# Patient Record
Sex: Female | Born: 1950
Health system: Southern US, Community
[De-identification: ages and names within clinical notes are randomized; demographics above are authoritative.]

## PROBLEM LIST (undated history)

## (undated) DIAGNOSIS — M51369 Other intervertebral disc degeneration, lumbar region without mention of lumbar back pain or lower extremity pain: Secondary | ICD-10-CM

## (undated) DIAGNOSIS — IMO0002 Reserved for concepts with insufficient information to code with codable children: Secondary | ICD-10-CM

## (undated) DIAGNOSIS — M5126 Other intervertebral disc displacement, lumbar region: Secondary | ICD-10-CM

## (undated) DIAGNOSIS — M8580 Other specified disorders of bone density and structure, unspecified site: Secondary | ICD-10-CM

## (undated) DIAGNOSIS — M75100 Unspecified rotator cuff tear or rupture of unspecified shoulder, not specified as traumatic: Secondary | ICD-10-CM

## (undated) DIAGNOSIS — J302 Other seasonal allergic rhinitis: Secondary | ICD-10-CM

## (undated) DIAGNOSIS — M5136 Other intervertebral disc degeneration, lumbar region: Secondary | ICD-10-CM

## (undated) DIAGNOSIS — S83519A Sprain of anterior cruciate ligament of unspecified knee, initial encounter: Secondary | ICD-10-CM

## (undated) DIAGNOSIS — E7800 Pure hypercholesterolemia, unspecified: Secondary | ICD-10-CM

## (undated) HISTORY — PX: APPENDECTOMY: SHX54

## (undated) HISTORY — DX: Pure hypercholesterolemia, unspecified: E78.00

## (undated) HISTORY — DX: Other specified disorders of bone density and structure, unspecified site: M85.80

## (undated) HISTORY — DX: Sprain of anterior cruciate ligament of unspecified knee, initial encounter: S83.519A

## (undated) HISTORY — DX: Unspecified rotator cuff tear or rupture of unspecified shoulder, not specified as traumatic: M75.100

## (undated) HISTORY — DX: Reserved for concepts with insufficient information to code with codable children: IMO0002

## (undated) HISTORY — DX: Other intervertebral disc displacement, lumbar region: M51.26

## (undated) HISTORY — DX: Other intervertebral disc degeneration, lumbar region: M51.36

## (undated) HISTORY — DX: Other intervertebral disc degeneration, lumbar region without mention of lumbar back pain or lower extremity pain: M51.369

---

## 2006-05-13 ENCOUNTER — Ambulatory Visit: Payer: Self-pay | Admitting: Orthopedic Surgery

## 2007-01-06 ENCOUNTER — Other Ambulatory Visit: Admission: RE | Admit: 2007-01-06 | Discharge: 2007-01-06 | Payer: Self-pay | Admitting: Obstetrics and Gynecology

## 2007-03-14 ENCOUNTER — Ambulatory Visit: Payer: Self-pay | Admitting: Orthopedic Surgery

## 2007-03-24 ENCOUNTER — Ambulatory Visit: Payer: Self-pay | Admitting: Orthopedic Surgery

## 2007-06-02 ENCOUNTER — Encounter (INDEPENDENT_AMBULATORY_CARE_PROVIDER_SITE_OTHER): Payer: Self-pay | Admitting: Radiology

## 2007-06-02 ENCOUNTER — Ambulatory Visit: Payer: Self-pay | Admitting: Orthopedic Surgery

## 2007-06-02 DIAGNOSIS — S92919A Unspecified fracture of unspecified toe(s), initial encounter for closed fracture: Secondary | ICD-10-CM | POA: Insufficient documentation

## 2008-01-12 ENCOUNTER — Other Ambulatory Visit: Admission: RE | Admit: 2008-01-12 | Discharge: 2008-01-12 | Payer: Self-pay | Admitting: Obstetrics and Gynecology

## 2010-09-07 ENCOUNTER — Encounter: Payer: Self-pay | Admitting: Orthopedic Surgery

## 2013-04-27 ENCOUNTER — Ambulatory Visit: Payer: Self-pay | Admitting: Certified Nurse Midwife

## 2013-05-04 ENCOUNTER — Encounter: Payer: Self-pay | Admitting: Certified Nurse Midwife

## 2013-05-09 ENCOUNTER — Encounter: Payer: Self-pay | Admitting: Certified Nurse Midwife

## 2013-05-09 ENCOUNTER — Ambulatory Visit (INDEPENDENT_AMBULATORY_CARE_PROVIDER_SITE_OTHER): Payer: PRIVATE HEALTH INSURANCE | Admitting: Certified Nurse Midwife

## 2013-05-09 VITALS — BP 110/64 | HR 64 | Resp 16 | Ht 66.0 in | Wt 178.0 lb

## 2013-05-09 DIAGNOSIS — Z01419 Encounter for gynecological examination (general) (routine) without abnormal findings: Secondary | ICD-10-CM

## 2013-05-09 DIAGNOSIS — Z0000 Encounter for general adult medical examination without abnormal findings: Secondary | ICD-10-CM

## 2013-05-09 LAB — POCT URINALYSIS DIPSTICK
Bilirubin, UA: NEGATIVE
Blood, UA: NEGATIVE
Glucose, UA: NEGATIVE
Ketones, UA: NEGATIVE

## 2013-05-09 NOTE — Patient Instructions (Signed)

## 2013-05-09 NOTE — Progress Notes (Signed)
62 y.o. G12P3003 Married Caucasian Fe here for annual exam.  Menopausal no HRT. Denies vaginal bleeding or vaginal dryness. Sees PCP for aex and labs. Still having issues with rotator cuff of left shoulder. Now has back issues slip discs, under follow up. New grandchild this year!! Spouse may loose job as VP at hospital so has had stress related to hoses issues. "taking one day at a time" No other health issues today.  LMP 08/17/1998. postmenopausal          Sexually active: yes  The current method of family planning is vasectomy.    Exercising: yes  physical therapy,walking Smoker:  no  Health Maintenance: Pap:  04-21-12 neg HPV HR neg MMG:  9/13 Colonoscopy:  none BMD:   2012 TDaP:  2013 Labs: Poct urine-neg Self breast exam: done monthly     Past Medical History  Diagnosis Date  . Osteopenia   . Hypercholesteremia   . Ulcer     Past Surgical History  Procedure Laterality Date  . Appendectomy    . Knee arthroscopy with anterior cruciate ligament (acl) repair Left     secondary to fall  . Rotator cuff repair Left     secondary to fall    Current Outpatient Prescriptions  Medication Sig Dispense Refill  . CALCIUM PO Take by mouth daily.      . Multiple Vitamins-Minerals (MULTIVITAMIN PO) Take by mouth daily.      Marland Kitchen SIMVASTATIN PO Take by mouth daily.       No current facility-administered medications for this visit.    Family History  Problem Relation Age of Onset  . Multiple myeloma Mother   . Cancer Father     throat  . Cancer Daughter     ovarian    ROS:  Pertinent items are noted in HPI.  Otherwise, a comprehensive ROS was negative.  Exam:   There were no vitals taken for this visit.    Ht Readings from Last 3 Encounters:  No data found for Ht    General appearance: alert, cooperative and appears stated age Head: Normocephalic, without obvious abnormality, atraumatic Neck: no adenopathy, supple, symmetrical, trachea midline and thyroid normal to  inspection and palpation Lungs: clear to auscultation bilaterally Breasts: normal appearance, no masses or tenderness, No nipple retraction or dimpling, No nipple discharge or bleeding, No axillary or supraclavicular adenopathy Heart: regular rate and rhythm Abdomen: soft, non-tender; no masses,  no organomegaly Extremities: extremities normal, atraumatic, no cyanosis or edema Skin: Skin color, texture, turgor normal. No rashes or lesions Lymph nodes: Cervical, supraclavicular, and axillary nodes normal. No abnormal inguinal nodes palpated Neurologic: Grossly normal   Pelvic: External genitalia:  no lesions              Urethra:  normal appearing urethra with no masses, tenderness or lesions              Bartholin's and Skene's: normal                 Vagina: normal appearing vagina with normal color and discharge, no lesions              Cervix: normal non tender              Pap taken: no Bimanual Exam:  Uterus:  normal size, contour, position, consistency, mobility, non-tender and mid position              Adnexa: normal adnexa and no mass, fullness, tenderness  Rectovaginal: Confirms               Anus:  normal sphincter tone, no lesions  A:  Well Woman with normal exam  Menopausal no HRT  Rotator cuff injury Left/ and slip disc in back under follow up  Social stress  P:     Reviewed health and wellness pertinent to exam  Pap smear as per guidelines   Mammogram yearly pap smear not taken today  counseled on breast self exam, mammography screening, menopause, adequate intake of calcium and vitamin D, diet and exercise, Kegel's exercises  return annually or prn  An After Visit Summary was printed and given to the patient.   Note reviewed, agree with plan.  Douglass Rivers, MD

## 2013-05-11 ENCOUNTER — Ambulatory Visit: Payer: Self-pay | Admitting: Certified Nurse Midwife

## 2013-12-12 ENCOUNTER — Ambulatory Visit (INDEPENDENT_AMBULATORY_CARE_PROVIDER_SITE_OTHER): Payer: PRIVATE HEALTH INSURANCE | Admitting: Cardiology

## 2013-12-12 VITALS — BP 115/83 | HR 65 | Ht 62.0 in | Wt 164.0 lb

## 2013-12-12 DIAGNOSIS — R079 Chest pain, unspecified: Secondary | ICD-10-CM

## 2013-12-12 NOTE — Progress Notes (Signed)
Clinical Summary Terri Robertson is a 63 y.o.female seen today as a new patient, she is referred for chest pain.  1. Chest pain - started approx 1 month ago. In midchest feeling heaviness, 8/10. +diaphoresis. +palpitations. Has occurred 3 times, all occurred at rest. Lasted approx 30 minutes. Worst with deep breathing. No relation to eating.  - remains active, denies any DOE with activity.   CAD risk factors: hyperlipidemia, father MI heart troubles in 51s, brother CABG 50s.      Past Medical History  Diagnosis Date  . Osteopenia   . Hypercholesteremia   . Ulcer   . Torn rotator cuff   . Torn ACL     & meniscus     No Known Allergies   Current Outpatient Prescriptions  Medication Sig Dispense Refill  . CALCIUM PO Take by mouth daily.      . Multiple Vitamins-Minerals (MULTIVITAMIN PO) Take by mouth daily.      . NONFORMULARY OR COMPOUNDED ITEM 3 (three) times daily. For pain      . pravastatin (PRAVACHOL) 40 MG tablet Take 40 mg by mouth daily.      . TRAMADOL HCL PO Take by mouth as needed.       No current facility-administered medications for this visit.     Past Surgical History  Procedure Laterality Date  . Appendectomy       No Known Allergies    Family History  Problem Relation Age of Onset  . Multiple myeloma Mother   . Cancer Father     throat  . Heart attack Father   . Cancer Daughter     ovarian  . Heart disease Brother      Social History Terri Robertson reports that she has never smoked. She does not have any smokeless tobacco history on file. Terri Robertson reports that she does not drink alcohol.   Review of Systems CONSTITUTIONAL: No weight loss, fever, chills, weakness or fatigue.  HEENT: Eyes: No visual loss, blurred vision, double vision or yellow sclerae.No hearing loss, sneezing, congestion, runny nose or sore throat.  SKIN: No rash or itching.  CARDIOVASCULAR: per HPI RESPIRATORY: No shortness of breath, cough or sputum.    GASTROINTESTINAL: No anorexia, nausea, vomiting or diarrhea. No abdominal pain or blood.  GENITOURINARY: No burning on urination, no polyuria NEUROLOGICAL: No headache, dizziness, syncope, paralysis, ataxia, numbness or tingling in the extremities. No change in bowel or bladder control.  MUSCULOSKELETAL: No muscle, back pain, joint pain or stiffness.  LYMPHATICS: No enlarged nodes. No history of splenectomy.  PSYCHIATRIC: No history of depression or anxiety.  ENDOCRINOLOGIC: No reports of sweating, cold or heat intolerance. No polyuria or polydipsia.  Marland Kitchen   Physical Examination There were no vitals filed for this visit. There were no vitals filed for this visit.  Gen: resting comfortably, no acute distress HEENT: no scleral icterus, pupils equal round and reactive, no palptable cervical adenopathy,  CV Resp: Clear to auscultation bilaterally GI: abdomen is soft, non-tender, non-distended, normal bowel sounds, no hepatosplenomegaly MSK: extremities are warm, no edema.  Skin: warm, no rash Neuro:  no focal deficits Psych: appropriate affect   Diagnostic Studies EKG NSR, non-spec ST/T changes    Assessment and Plan  1. Chest pain - unclear etiology, she does have multiple CAD risk factors - will obtain stress echo to further evaluate - if negative, consider possible symptomatic arrhythmia as symptoms typically are associated with palpitations.    F/u pending results of  stress echo   Arnoldo Lenis, M.D., F.A.C.C.

## 2013-12-12 NOTE — Patient Instructions (Signed)
Your physician recommends that you schedule a follow-up appointment to be determined after stress test    Your physician recommends that you continue on your current medications as directed. Please refer to the Current Medication list given to you today.    Your physician has requested that you have a stress echocardiogram. For further information please visit [email or web address]. Please follow instruction sheet as given.     Thank you for choosing [facility] !

## 2013-12-19 ENCOUNTER — Encounter (HOSPITAL_COMMUNITY): Payer: Self-pay

## 2013-12-19 ENCOUNTER — Ambulatory Visit (HOSPITAL_COMMUNITY)
Admission: RE | Admit: 2013-12-19 | Discharge: 2013-12-19 | Disposition: A | Discharge status: Home / Self Care | Payer: PRIVATE HEALTH INSURANCE | Source: Ambulatory Visit | Attending: Cardiology | Admitting: Cardiology

## 2013-12-19 DIAGNOSIS — R079 Chest pain, unspecified: Secondary | ICD-10-CM

## 2013-12-19 DIAGNOSIS — Z6830 Body mass index (BMI) 30.0-30.9, adult: Secondary | ICD-10-CM | POA: Insufficient documentation

## 2013-12-19 DIAGNOSIS — R072 Precordial pain: Secondary | ICD-10-CM

## 2013-12-19 DIAGNOSIS — E785 Hyperlipidemia, unspecified: Secondary | ICD-10-CM | POA: Insufficient documentation

## 2013-12-19 NOTE — Progress Notes (Signed)
Stress Lab Nurses Notes - [name]  Terri Robertson 12/19/2013 Reason for doing test: Chest Pain Type of test: Stress Echo Nurse performing test: Gerrit Halls, RN Nuclear Medicine Tech: Not Applicable Echo Tech: Jamison Neighbor MD performing test: S. McDowell/K.Purcell Nails NP Family MD: [place] Crane Test explained and consent signed: yes IV started: No IV started Symptoms: fatigue Treatment/Intervention: None Reason test stopped: fatigue and reached target HR After recovery IV was: NA Patient to return to [facility]. Med at : NA Patient discharged: Home Patient's Condition upon discharge was: stable Comments: During test peak BP 170/84 & HR179.  Recovery BP 136/83 & HR 101. Symptoms resolved in recovery. [name]

## 2013-12-19 NOTE — Progress Notes (Signed)
  Echocardiogram Echocardiogram Stress Test has been performed.  Kyrus Hyde L Anecia Nusbaum 12/19/2013, 9:08 AM

## 2014-01-30 ENCOUNTER — Ambulatory Visit: Payer: PRIVATE HEALTH INSURANCE | Admitting: Cardiology

## 2014-02-06 ENCOUNTER — Encounter: Payer: Self-pay | Admitting: Cardiology

## 2014-02-06 ENCOUNTER — Ambulatory Visit (INDEPENDENT_AMBULATORY_CARE_PROVIDER_SITE_OTHER): Payer: PRIVATE HEALTH INSURANCE | Admitting: Cardiology

## 2014-02-06 VITALS — BP 117/79 | HR 81 | Ht 62.0 in | Wt 162.0 lb

## 2014-02-06 DIAGNOSIS — R0789 Other chest pain: Secondary | ICD-10-CM

## 2014-02-06 NOTE — Patient Instructions (Signed)
Continue all medications.   Follow up as needed

## 2014-02-06 NOTE — Progress Notes (Signed)
Clinical Summary Ms. Telfair is a 63 y.o.female seen today for follow up of the following medical problems.    1. Chest pain  - no symptoms since last visit - remains active, denies any DOE with activity.  - bicycling trip went 17 miles without troubles.  - since last visit completed stress echo which ws negative.    Past Medical History  Diagnosis Date  . Osteopenia   . Hypercholesteremia   . Ulcer   . Torn rotator cuff   . Torn ACL     & meniscus     No Known Allergies   Current Outpatient Prescriptions  Medication Sig Dispense Refill  . diclofenac (FLECTOR) 1.3 % PTCH Place 1 patch onto the skin daily.      . Multiple Vitamins-Minerals (MULTIVITAMIN PO) Take by mouth daily.      . pantoprazole (PROTONIX) 40 MG tablet Take 40 mg by mouth daily.      . pravastatin (PRAVACHOL) 40 MG tablet Take 20 mg by mouth daily.       . TRAMADOL HCL PO Take 50 mg by mouth as needed.        No current facility-administered medications for this visit.     Past Surgical History  Procedure Laterality Date  . Appendectomy       No Known Allergies    Family History  Problem Relation Age of Onset  . Multiple myeloma Mother   . Cancer Father     throat  . Heart attack Father   . Cancer Daughter     ovarian  . Heart disease Brother      Social History Ms. Heisler reports that she has never smoked. She does not have any smokeless tobacco history on file. Ms. Wycoff reports that she does not drink alcohol.   Review of Systems CONSTITUTIONAL: No weight loss, fever, chills, weakness or fatigue.  HEENT: Eyes: No visual loss, blurred vision, double vision or yellow sclerae.No hearing loss, sneezing, congestion, runny nose or sore throat.  SKIN: No rash or itching.  CARDIOVASCULAR: per HPI RESPIRATORY: No shortness of breath, cough or sputum.  GASTROINTESTINAL: No anorexia, nausea, vomiting or diarrhea. No abdominal pain or blood.  GENITOURINARY: No burning on  urination, no polyuria NEUROLOGICAL: No headache, dizziness, syncope, paralysis, ataxia, numbness or tingling in the extremities. No change in bowel or bladder control.  MUSCULOSKELETAL: No muscle, back pain, joint pain or stiffness.  LYMPHATICS: No enlarged nodes. No history of splenectomy.  PSYCHIATRIC: No history of depression or anxiety.  ENDOCRINOLOGIC: No reports of sweating, cold or heat intolerance. No polyuria or polydipsia.  Marland Kitchen   Physical Examination p 81 bp 117/79 Wt 162 lbs BMI 30 Gen: resting comfortably, no acute distress HEENT: no scleral icterus, pupils equal round and reactive, no palptable cervical adenopathy,  CV: RRR, no m/r/g, no JVD, no carotid bruits Resp: Clear to auscultation bilaterally GI: abdomen is soft, non-tender, non-distended, normal bowel sounds, no hepatosplenomegaly MSK: extremities are warm, no edema.  Skin: warm, no rash Neuro:  no focal deficits Psych: appropriate affect   Diagnostic Studies 12/19/13 Stress echo Study Conclusions  - Stress ECG conclusions: The stress ECG was equivocal for ischemia. [facility] scoring: exercise time of 11mn; maximum ST deviation of 0.556m no angina; resulting score is 8. This score predicts a low risk of cardiac events. - Staged echo: There was no diagnostic evidence for stress-induced ischemia.     Assessment and Plan  1. Chest pain  - negative  stress echo for ischemia, no clear cardiac cause of her symptoms - no recurrent symptoms - continue to follow clinically, no further cardiac testing planned at this time.    F/u as needed  Arnoldo Lenis, M.D.

## 2014-05-09 ENCOUNTER — Encounter: Payer: Self-pay | Admitting: Certified Nurse Midwife

## 2014-05-10 ENCOUNTER — Ambulatory Visit: Payer: PRIVATE HEALTH INSURANCE | Admitting: Certified Nurse Midwife

## 2014-05-29 ENCOUNTER — Ambulatory Visit (INDEPENDENT_AMBULATORY_CARE_PROVIDER_SITE_OTHER): Payer: PRIVATE HEALTH INSURANCE | Admitting: Certified Nurse Midwife

## 2014-05-29 ENCOUNTER — Encounter: Payer: Self-pay | Admitting: Certified Nurse Midwife

## 2014-05-29 VITALS — BP 124/70 | HR 72 | Ht 62.0 in | Wt 156.0 lb

## 2014-05-29 DIAGNOSIS — Z0000 Encounter for general adult medical examination without abnormal findings: Secondary | ICD-10-CM

## 2014-05-29 DIAGNOSIS — Z124 Encounter for screening for malignant neoplasm of cervix: Secondary | ICD-10-CM

## 2014-05-29 DIAGNOSIS — Z01419 Encounter for gynecological examination (general) (routine) without abnormal findings: Secondary | ICD-10-CM

## 2014-05-29 LAB — POCT URINALYSIS DIPSTICK
BILIRUBIN UA: NEGATIVE
Blood, UA: NEGATIVE
GLUCOSE UA: NEGATIVE
KETONES UA: NEGATIVE
Leukocytes, UA: NEGATIVE
NITRITE UA: NEGATIVE
Protein, UA: NEGATIVE
Urobilinogen, UA: NEGATIVE
pH, UA: 8

## 2014-05-29 NOTE — Patient Instructions (Signed)

## 2014-05-29 NOTE — Progress Notes (Signed)
63 y.o. G22P3003 Married Caucasian Fe here for annual exam. Menopausal no HRT. Denies vaginal bleeding or vaginal dryness. Sold house due to management change with spouse at hospital in [place]. Has been in a town house, now has purchased a [employer] which is better, so less stress now. Stress level is better. Injured back during moving and now receiving epidural injections for treatment.  Patient's last menstrual period was 08/17/1998.          Sexually active: Yes.    The current method of family planning is post menopausal status.    Exercising: Yes.    Home exercise routine includes core muscle excercise. Smoker:  no  Health Maintenance: Pap:  04/21/12 neg HPV HR neg MMG:  9/13 Bi-Rads Neg Colonoscopy:  none BMD:   2012 TDaP:  2013 Labs: pcp,   UA: neg, ph: 8.0   reports that she has never smoked. She has never used smokeless tobacco. She reports that she does not drink alcohol or use illicit drugs.  Past Medical History  Diagnosis Date  . Osteopenia   . Hypercholesteremia   . Ulcer   . Torn rotator cuff   . Torn ACL     & meniscus    Past Surgical History  Procedure Laterality Date  . Appendectomy      Current Outpatient Prescriptions  Medication Sig Dispense Refill  . diclofenac (FLECTOR) 1.3 % PTCH Place 1 patch onto the skin daily.      . Multiple Vitamins-Minerals (MULTIVITAMIN PO) Take by mouth daily.      . pravastatin (PRAVACHOL) 40 MG tablet Take 20 mg by mouth daily.       . TRAMADOL HCL PO Take 50 mg by mouth as needed.        No current facility-administered medications for this visit.    Family History  Problem Relation Age of Onset  . Multiple myeloma Mother   . Cancer Father     throat  . Heart attack Father   . Cancer Daughter     ovarian  . Heart disease Brother     ROS:  Pertinent items are noted in HPI.  Otherwise, a comprehensive ROS was negative.  Exam:   BP 124/70  Pulse 72  Ht 5' 2"  (1.575 m)  Wt 156 lb (70.761 kg)  BMI 28.53 kg/m2  LMP  08/17/1998 Height: 5' 2"  (157.5 cm)  Ht Readings from Last 3 Encounters:  05/29/14 5' 2"  (1.575 m)  02/06/14 5' 2"  (1.575 m)  12/12/13 5' 2"  (1.575 m)    General appearance: alert, cooperative and appears stated age Head: Normocephalic, without obvious abnormality, atraumatic Neck: no adenopathy, supple, symmetrical, trachea midline and thyroid normal to inspection and palpation Lungs: clear to auscultation bilaterally Breasts: normal appearance, no masses or tenderness, No nipple retraction or dimpling, No nipple discharge or bleeding, No axillary or supraclavicular adenopathy Heart: regular rate and rhythm Abdomen: soft, non-tender; no masses,  no organomegaly Extremities: extremities normal, atraumatic, no cyanosis or edema Skin: Skin color, texture, turgor normal. No rashes or lesions Lymph nodes: Cervical, supraclavicular, and axillary nodes normal. No abnormal inguinal nodes palpated Neurologic: Grossly normal   Pelvic: External genitalia:  no lesions              Urethra:  normal appearing urethra with no masses, tenderness or lesions              Bartholin's and Skene's: normal  Vagina: normal appearing vagina with normal color and discharge, no lesions              Cervix: normal, non tender, no lesions              Pap taken: Yes.   Bimanual Exam:  Uterus:  normal size, contour, position, consistency, mobility, non-tender and anteverted              Adnexa: normal adnexa and no mass, fullness, tenderness               Rectovaginal: Confirms               Anus:  normal sphincter tone, no lesions  A:  Well Woman with normal exam  Menopausal  No HRT  Recent back injury with moving under MD management  Social stress with move and family  Colonoscopy due  Mammogram over due  P:   Reviewed health and wellness pertinent to exam  Aware of need to evaluate if vaginal bleeding  Continue follow up as indicated  Encouraged to seek friend and church support and  time for self  Stressed importance of colonoscopy, will schedule, declines IFOB  Given information to schedule mammogram and BMD today! Patient will schedule.  Pap smear taken today with HPV reflex   counseled on breast self exam, mammography screening, menopause, osteoporosis, adequate intake of calcium and vitamin D, diet and exercise  return annually or prn  An After Visit Summary was printed and given to the patient.

## 2014-05-30 DIAGNOSIS — Z124 Encounter for screening for malignant neoplasm of cervix: Secondary | ICD-10-CM

## 2014-05-30 LAB — IPS PAP TEST WITH REFLEX TO HPV

## 2014-06-08 NOTE — Progress Notes (Signed)
Reviewed personally.  M. Suzanne Ellena Kamen, MD.  

## 2014-06-18 ENCOUNTER — Encounter: Payer: Self-pay | Admitting: Certified Nurse Midwife

## 2014-07-04 ENCOUNTER — Telehealth: Payer: Self-pay

## 2014-07-04 NOTE — Telephone Encounter (Signed)
Patient aware of need to come in and discuss BMD results due to being a fracture risk.  Scheduled patient for 09/20/14 at 8:00 with Dr. Quincy Simmonds  (Chart In Your Door Dr. Quincy Simmonds- CC: Ms. Jackelyn Poling)  Routed to provider for review, encounter closed.

## 2014-07-04 NOTE — Telephone Encounter (Signed)
lmtcb regarding bmd

## 2014-07-17 ENCOUNTER — Telehealth: Payer: Self-pay

## 2014-07-17 NOTE — Telephone Encounter (Signed)
Pt states she would like a call back to answer a question from her EOB. She states there a charge for 16.00 on a day that she wasn't in the office

## 2014-07-17 NOTE — Telephone Encounter (Signed)
Terri Robertson, Can you take a look at this one and review?

## 2014-09-20 ENCOUNTER — Ambulatory Visit (INDEPENDENT_AMBULATORY_CARE_PROVIDER_SITE_OTHER): Payer: PRIVATE HEALTH INSURANCE | Admitting: Obstetrics and Gynecology

## 2014-09-20 ENCOUNTER — Encounter: Payer: Self-pay | Admitting: Certified Nurse Midwife

## 2014-09-20 ENCOUNTER — Encounter: Payer: Self-pay | Admitting: Obstetrics and Gynecology

## 2014-09-20 VITALS — BP 130/80 | HR 66 | Ht 66.0 in | Wt 162.2 lb

## 2014-09-20 DIAGNOSIS — M8580 Other specified disorders of bone density and structure, unspecified site: Secondary | ICD-10-CM

## 2014-09-20 LAB — COMPREHENSIVE METABOLIC PANEL
ALBUMIN: 4 g/dL (ref 3.5–5.2)
ALK PHOS: 65 U/L (ref 39–117)
ALT: 18 U/L (ref 0–35)
AST: 15 U/L (ref 0–37)
BUN: 17 mg/dL (ref 6–23)
CO2: 31 meq/L (ref 19–32)
CREATININE: 0.68 mg/dL (ref 0.50–1.10)
Calcium: 9.6 mg/dL (ref 8.4–10.5)
Chloride: 102 mEq/L (ref 96–112)
Glucose, Bld: 98 mg/dL (ref 70–99)
Potassium: 4.8 mEq/L (ref 3.5–5.3)
Sodium: 139 mEq/L (ref 135–145)
TOTAL PROTEIN: 6.3 g/dL (ref 6.0–8.3)
Total Bilirubin: 0.4 mg/dL (ref 0.2–1.2)

## 2014-09-20 LAB — CBC
HEMATOCRIT: 45.6 % (ref 36.0–46.0)
Hemoglobin: 14.7 g/dL (ref 12.0–15.0)
MCH: 28.4 pg (ref 26.0–34.0)
MCHC: 32.2 g/dL (ref 30.0–36.0)
MCV: 88.2 fL (ref 78.0–100.0)
MPV: 8.5 fL — ABNORMAL LOW (ref 8.6–12.4)
Platelets: 337 10*3/uL (ref 150–400)
RBC: 5.17 MIL/uL — AB (ref 3.87–5.11)
RDW: 14.3 % (ref 11.5–15.5)
WBC: 6.3 10*3/uL (ref 4.0–10.5)

## 2014-09-20 NOTE — Telephone Encounter (Signed)
I have contacted this patient regarding mychart message sent today 09/20/2014. Please see mychart message I have sent to patient below.  Mrs. Gerilyn Nestle,  My name is Verline Lema. I am one of the triage nurses here in the office with Dr.Silva.I have changed your height in your record to 5'6". Please let us know if there is anything further we may help you with. I do apologize for the need of the correction.  Have a great day!  Leandra Kern, RN  Routing to provider for final review. Patient agreeable to disposition. Will close encounter

## 2014-09-20 NOTE — Patient Instructions (Addendum)
Osteoporosis Throughout your life, your body breaks down old bone and replaces it with new bone. As you get older, your body does not replace bone as quickly as it breaks it down. By the age of 30 years, most people begin to gradually lose bone because of the imbalance between bone loss and replacement. Some people lose more bone than others. Bone loss beyond a specified normal degree is considered osteoporosis.  Osteoporosis affects the strength and durability of your bones. The inside of the ends of your bones and your flat bones, like the bones of your pelvis, look like honeycomb, filled with tiny open spaces. As bone loss occurs, your bones become less dense. This means that the open spaces inside your bones become bigger and the walls between these spaces become thinner. This makes your bones weaker. Bones of a person with osteoporosis can become so weak that they can break (fracture) during minor accidents, such as a simple fall. CAUSES  The following factors have been associated with the development of osteoporosis:  Smoking.  Drinking more than 2 alcoholic drinks several days per week.  Long-term use of certain medicines:  Corticosteroids.  Chemotherapy medicines.  Thyroid medicines.  Antiepileptic medicines.  Gonadal hormone suppression medicine.  Immunosuppression medicine.  Being underweight.  Lack of physical activity.  Lack of exposure to the sun. This can lead to vitamin D deficiency.  Certain medical conditions:  Certain inflammatory bowel diseases, such as Crohn disease and ulcerative colitis.  Diabetes.  Hyperthyroidism.  Hyperparathyroidism. RISK FACTORS Anyone can develop osteoporosis. However, the following factors can increase your risk of developing osteoporosis:  Gender--Women are at higher risk than men.  Age--Being older than 50 years increases your risk.  Ethnicity--White and Asian people have an increased risk.  Weight --Being extremely  underweight can increase your risk of osteoporosis.  Family history of osteoporosis--Having a family member who has developed osteoporosis can increase your risk. SYMPTOMS  Usually, people with osteoporosis have no symptoms.  DIAGNOSIS  Signs during a physical exam that may prompt your caregiver to suspect osteoporosis include:  Decreased height. This is usually caused by the compression of the bones that form your spine (vertebrae) because they have weakened and become fractured.  A curving or rounding of the upper back (kyphosis). To confirm signs of osteoporosis, your caregiver may request a procedure that uses 2 low-dose X-ray beams with different levels of energy to measure your bone mineral density (dual-energy X-ray absorptiometry [DXA]). Also, your caregiver may check your level of vitamin D. TREATMENT  The goal of osteoporosis treatment is to strengthen bones in order to decrease the risk of bone fractures. There are different types of medicines available to help achieve this goal. Some of these medicines work by slowing the processes of bone loss. Some medicines work by increasing bone density. Treatment also involves making sure that your levels of calcium and vitamin D are adequate. PREVENTION  There are things you can do to help prevent osteoporosis. Adequate intake of calcium and vitamin D can help you achieve optimal bone mineral density. Regular exercise can also help, especially resistance and weight-bearing activities. If you smoke, quitting smoking is an important part of osteoporosis prevention. MAKE SURE YOU:  Understand these instructions.  Will watch your condition.  Will get help right away if you are not doing well or get worse. FOR MORE INFORMATION www.osteo.org and www.nof.org Document Released: 05/13/2005 Document Revised: 11/28/2012 Document Reviewed: 07/18/2011 Exit[employer] Patient Information 2015 [employer]. This information is not   intended to replace advice  given to you by your health care provider. Make sure you discuss any questions you have with your health care provider.  Raloxifene tablets What is this medicine? RALOXIFENE (ral OX i feen) reduces the amount of calcium lost from bones. It is used to treat and prevent osteoporosis in women who have experienced menopause. This medicine may be used for other purposes; ask your health care provider or pharmacist if you have questions. COMMON BRAND NAME(S): Evista What should I tell my health care provider before I take this medicine? They need to know if you have any of these conditions: -a history of blood clots -cancer -heart failure -liver disease -premenopausal -an unusual or allergic reaction to raloxifene, other medicines, foods, dyes, or preservatives -pregnant or trying to get pregnant -breast-feeding How should I use this medicine? Take this medicine by mouth with a glass of water. Follow the directions on the prescription label. The tablets can be taken with or without food. Take your doses at regular intervals. Do not take your medicine more often than directed. Talk to your pediatrician regarding the use of this medicine in children. Special care may be needed. Overdosage: If you think you have taken too much of this medicine contact a poison control center or emergency room at once. NOTE: This medicine is only for you. Do not share this medicine with others. What if I miss a dose? If you miss a dose, take it as soon as you can. If it is almost time for your next dose, take only that dose. Do not take double or extra doses. What may interact with this medicine? -ampicillin -cholestyramine -colestipol -diazepam -diazoxide -female hormones like hormone replacement therapy -lidocaine -warfarin This list may not describe all possible interactions. Give your health care provider a list of all the medicines, herbs, non-prescription drugs, or dietary supplements you use. Also tell  them if you smoke, drink alcohol, or use illegal drugs. Some items may interact with your medicine. What should I watch for while using this medicine? Visit your doctor or health care professional for regular checks on your progress. Do not stop taking this medicine except on the advice of your doctor or health care professional. You should make sure you get enough calcium and vitamin D in your diet while you are taking this medicine. Discuss your dietary needs with your health care professional or nutritionist. Exercise may help to prevent bone loss. Discuss your exercise needs with your doctor or health care professional. This medicine can rarely cause blood clots. You should avoid long periods of bed rest while taking this medicine. If you are going to have surgery, tell your doctor or health care professional that you are taking this medicine. This medicine should be stopped at least 3 days before surgery. After surgery, it should be restarted only after you are walking again. It should not be restarted while you still need long periods of bed rest. You should not smoke while taking this medicine. Smoking may also increase your risk of blood clots. Smoking can also decrease the effects of this medicine. This medicine does not prevent hot flashes. It may cause hot flashes in some patients at the start of therapy. What side effects may I notice from receiving this medicine? Side effects that you should report to your doctor or health care professional as soon as possible: -change in vision -chest pain -difficulty breathing -leg pain or swelling -skin rash, itching Side effects that usually do not require  medical attention (report to your doctor or health care professional if they continue or are bothersome): -fluid build-up -leg cramps -stomach pain -sweating This list may not describe all possible side effects. Call your doctor for medical advice about side effects. You may report side effects  to FDA at 1-800-FDA-1088. Where should I keep my medicine? Keep out of the reach of children. Store at room temperature between 15 and 30 degrees C (59 and 86 degrees F). Throw away any unused medicine after the expiration date. NOTE: This sheet is a summary. It may not cover all possible information. If you have questions about this medicine, talk to your doctor, pharmacist, or health care provider.  2015, Elsevier/Gold Standard. (2008-07-19 15:15:14)  Denosumab injection What is this medicine? DENOSUMAB (den oh sue mab) slows bone breakdown. Prolia is used to treat osteoporosis in women after menopause and in men. Delton See is used to prevent bone fractures and other bone problems caused by cancer bone metastases. Delton See is also used to treat giant cell tumor of the bone. This medicine may be used for other purposes; ask your health care provider or pharmacist if you have questions. COMMON BRAND NAME(S): Prolia, XGEVA What should I tell my health care provider before I take this medicine? They need to know if you have any of these conditions: -dental disease -eczema -infection or history of infections -kidney disease or on dialysis -low blood calcium or vitamin D -malabsorption syndrome -scheduled to have surgery or tooth extraction -taking medicine that contains denosumab -thyroid or parathyroid disease -an unusual reaction to denosumab, other medicines, foods, dyes, or preservatives -pregnant or trying to get pregnant -breast-feeding How should I use this medicine? This medicine is for injection under the skin. It is given by a health care professional in a hospital or clinic setting. If you are getting Prolia, a special MedGuide will be given to you by the pharmacist with each prescription and refill. Be sure to read this information carefully each time. For Prolia, talk to your pediatrician regarding the use of this medicine in children. Special care may be needed. For Delton See, talk to  your pediatrician regarding the use of this medicine in children. While this drug may be prescribed for children as young as 13 years for selected conditions, precautions do apply. Overdosage: If you think you've taken too much of this medicine contact a poison control center or emergency room at once. Overdosage: If you think you have taken too much of this medicine contact a poison control center or emergency room at once. NOTE: This medicine is only for you. Do not share this medicine with others. What if I miss a dose? It is important not to miss your dose. Call your doctor or health care professional if you are unable to keep an appointment. What may interact with this medicine? Do not take this medicine with any of the following medications: -other medicines containing denosumab This medicine may also interact with the following medications: -medicines that suppress the immune system -medicines that treat cancer -steroid medicines like prednisone or cortisone This list may not describe all possible interactions. Give your health care provider a list of all the medicines, herbs, non-prescription drugs, or dietary supplements you use. Also tell them if you smoke, drink alcohol, or use illegal drugs. Some items may interact with your medicine. What should I watch for while using this medicine? Visit your doctor or health care professional for regular checks on your progress. Your doctor or health care professional may  order blood tests and other tests to see how you are doing. Call your doctor or health care professional if you get a cold or other infection while receiving this medicine. Do not treat yourself. This medicine may decrease your body's ability to fight infection. You should make sure you get enough calcium and vitamin D while you are taking this medicine, unless your doctor tells you not to. Discuss the foods you eat and the vitamins you take with your health care professional. See  your dentist regularly. Brush and floss your teeth as directed. Before you have any dental work done, tell your dentist you are receiving this medicine. Do not become pregnant while taking this medicine or for 5 months after stopping it. Women should inform their doctor if they wish to become pregnant or think they might be pregnant. There is a potential for serious side effects to an unborn child. Talk to your health care professional or pharmacist for more information. What side effects may I notice from receiving this medicine? Side effects that you should report to your doctor or health care professional as soon as possible: -allergic reactions like skin rash, itching or hives, swelling of the face, lips, or tongue -breathing problems -chest pain -fast, irregular heartbeat -feeling faint or lightheaded, falls -fever, chills, or any other sign of infection -muscle spasms, tightening, or twitches -numbness or tingling -skin blisters or bumps, or is dry, peels, or red -slow healing or unexplained pain in the mouth or jaw -unusual bleeding or bruising Side effects that usually do not require medical attention (Report these to your doctor or health care professional if they continue or are bothersome.): -muscle pain -stomach upset, gas This list may not describe all possible side effects. Call your doctor for medical advice about side effects. You may report side effects to FDA at 1-800-FDA-1088. Where should I keep my medicine? This medicine is only given in a clinic, doctor's office, or other health care setting and will not be stored at home. NOTE: This sheet is a summary. It may not cover all possible information. If you have questions about this medicine, talk to your doctor, pharmacist, or health care provider.  2015, Elsevier/Gold Standard. (2012-02-01 12:37:47)

## 2014-09-20 NOTE — Progress Notes (Signed)
Patient ID: Terri Robertson, female   DOB: 10-02-1950, 64 y.o.   MRN: [id number] GYNECOLOGY VISIT  PCP: Delphina Cahill, MD Referring provider:   HPI: 64 y.o.   Married  Caucasian  female   [phone] with Patient's last menstrual period was 08/17/1998.   here for consultation regarding DEXA results.  New diagnosis of osteoporosis.  Has had osteopenia.   Taking Vit D 2400 IU daily and calcium 600 mg once daily.   Family history of heart disease. Had a stress test and patient had a normal study.   Not doing weight bearing exercise due to herniated discs. Has had multiple steroid injections for pain.  Had 3 injections last year.   Not a smoker.  No ETOH use.   Had a toe fracture after lost balance.   History of gastric ulcer.  Treated with antibiotics in past.  No current problems.  Takes NSAIDs without problems.   No history of reflux.   Status mother had a hip fracture and expired following this. She had bone cancer.   GYNECOLOGIC HISTORY: Patient's last menstrual period was 08/17/1998. Sexually active:  yes Partner preference: female Contraception: postmenopausal Menopausal hormone therapy: none DES exposure:  no  Blood transfusions:  no  Sexually transmitted diseases:   no GYN procedures and prior surgeries:  none Last mammogram: 06-22-14 LOV:FIEPP                Last pap and high risk HPV testing:  05-29-14 wnl:no HPV done  History of abnormal pap smear:  no  OB History    Gravida Para Term Preterm AB TAB SAB Ectopic Multiple Living   _0 LIFESTYLE: Exercise:   Home exercising includes core exercise            Tobacco:   No Alcohol:   No Drug use:  no  Patient Active Problem List   Diagnosis Date Noted  . FRACTURE, TOE 06/02/2007    Past Medical History  Diagnosis Date  . Osteopenia   . Hypercholesteremia   . Ulcer   . Torn rotator cuff   . Torn ACL     & meniscus  . Bulging lumbar disc     Past Surgical History  Procedure Laterality  Date  . Appendectomy      Current Outpatient Prescriptions  Medication Sig Dispense Refill  . calcium carbonate (OS-CAL) 600 MG TABS tablet Take 600 mg by mouth daily. Takes 3 (?) 648m of calcium daily**    . Multiple Vitamins-Minerals (MULTIVITAMIN PO) Take by mouth daily.    . pravastatin (PRAVACHOL) 40 MG tablet Take 20 mg by mouth daily.     .Marland KitchenVITAMIN D, ERGOCALCIFEROL, PO Take 1 tablet by mouth daily. Takes 3 tablets of 8019mdaily for a total of 240044m    . TRAMADOL HCL PO Take 50 mg by mouth as needed.      No current facility-administered medications for this visit.     ALLERGIES: Codeine  Family History  Problem Relation Age of Onset  . Multiple myeloma Mother   . Cancer Father     throat  . Heart attack Father   . Cancer Daughter     ovarian  . Heart disease Brother     History   Social History  . Marital Status: Married    Spouse Name: N/A    Number of Children: N/A  . Years of Education: N/A  Occupational History  . Not on file.   Social History Main Topics  . Smoking status: Never Smoker   . Smokeless tobacco: Never Used  . Alcohol Use: No  . Drug Use: No  . Sexual Activity:    Partners: Male     Comment: husband vasectomy   Other Topics Concern  . Not on file   Social History Narrative    ROS:  Pertinent items are noted in HPI.  PHYSICAL EXAMINATION:    BP 130/80 mmHg  Pulse 66  Ht 5' 2" (1.575 m)  Wt 162 lb 3.2 oz (73.573 kg)  BMI 29.66 kg/m2  LMP 08/17/1998   Wt Readings from Last 3 Encounters:  09/20/14 162 lb 3.2 oz (73.573 kg)  05/29/14 156 lb (70.761 kg)  02/06/14 162 lb (73.483 kg)     Ht Readings from Last 3 Encounters:  09/20/14 5' 2" (1.575 m)  05/29/14 5' 2" (1.575 m)  02/06/14 5' 2" (1.575 m)    General appearance: alert, cooperative and appears stated age   Bone density -  Spine T score: - 0.3 Left hip T score: - 2.0 Right hip T score: - 2.2  FRAX - 2% risk of hip fracture, 21.4% risk of major  osteoporotic fracture.  ASSESSMENT  Osteopenia.  Increased risk of fracture.  Family history of fracture in mother who had bone cancer. History of ulcerative disease.  PLAN  Reviewed bone density report.  Comprehensive discussion of osteopenia and osteoporosis:  Disease processes, risk factors, morbidity and mortality related to fractures, and treatment options.  Discussed options: bisphosphonates, Evista, Prolia, Forteo.  I favor Evista or Prolia.  Discussed fall reduction. Discussed effect of smoking and ETOH on bone health and fracture risk. Will check CBC, CMP, and vit D levels.  Discussed weight bearing exercise, calcium, and vitamin D.  Patient will consider options and do observational management for now.  Next bone density in 2 years.    25 minutes face to face time of which over 50% was spent in counseling.   An After Visit Summary was printed and given to the patient.        

## 2014-09-21 LAB — VITAMIN D 25 HYDROXY (VIT D DEFICIENCY, FRACTURES): Vit D, 25-Hydroxy: 34 ng/mL (ref 30–100)

## 2014-09-21 LAB — TSH: TSH: 2.028 u[IU]/mL (ref 0.350–4.500)

## 2014-09-25 ENCOUNTER — Telehealth: Payer: Self-pay

## 2014-09-25 NOTE — Telephone Encounter (Signed)
Spoke with patient in regards to height correction request. Patient is agreeable to come in for height check. Requesting appointment at the beginning of March due to travel and work.Appointment scheduled for 3/1 at 9:30am. Agreeable to date and time. No copay charge per Seth Bake.  Routing to provider for final review. Patient agreeable to disposition. Will close encounter

## 2014-09-25 NOTE — Telephone Encounter (Deleted)
-----   Message from [place], MD sent at 09/22/2014  8:34 PM EST ----- Regarding: We need to check the patient's height.  Marcene Laskowski,  I just reviewed the patient's height, and it states 5 feet 2 inches for the last several visits.   I think that it is best for the patient to come in to be measured.   We are following her for osteopenia, and it is important to be accurate.   I do not know if everyone is just carrying her measurement forward at her visits or if she has really lost some height.   Thanks,   [place]

## 2014-10-09 ENCOUNTER — Telehealth: Payer: Self-pay | Admitting: Obstetrics and Gynecology

## 2014-10-09 NOTE — Telephone Encounter (Signed)
Patient canceled her appointment 10/16/14. Patient did not wish to reschedule.Marland Kitchen

## 2014-10-10 NOTE — Telephone Encounter (Signed)
Patient does not need to reschedule her appointment.  She may see Terri Robertson for her annual exam in the fall and have her height checked then.  She had wanted a correction in her chart regarding her height measurement, and I wanted [place] to see if she is loosing height due to her bone thinning.  Encounter closed.   Terri Robertson

## 2014-10-10 NOTE — Telephone Encounter (Signed)
FYI--Dr. Quincy Simmonds, see note.  Patient cancelled appointment and did not r/s.  Does she need to reschedule?  Has AEX 06-04-15 with Debbie. Routed to Dr. Quincy Simmonds

## 2014-10-16 ENCOUNTER — Ambulatory Visit: Payer: PRIVATE HEALTH INSURANCE

## 2015-06-04 ENCOUNTER — Ambulatory Visit: Payer: PRIVATE HEALTH INSURANCE | Admitting: Certified Nurse Midwife

## 2015-06-13 ENCOUNTER — Encounter: Payer: Self-pay | Admitting: Certified Nurse Midwife

## 2015-06-13 ENCOUNTER — Ambulatory Visit (INDEPENDENT_AMBULATORY_CARE_PROVIDER_SITE_OTHER): Payer: PRIVATE HEALTH INSURANCE | Admitting: Certified Nurse Midwife

## 2015-06-13 VITALS — BP 140/92 | HR 70 | Resp 18 | Ht 66.0 in | Wt 178.0 lb

## 2015-06-13 DIAGNOSIS — Z01419 Encounter for gynecological examination (general) (routine) without abnormal findings: Secondary | ICD-10-CM | POA: Diagnosis not present

## 2015-06-13 NOTE — Progress Notes (Signed)
Patient ID: Terri Robertson, female   DOB: [birth year], 64 y.o.   MRN: [id number] 64 y.o. G39P3003 Married  Caucasian Fe here for annual exam.Menopausal no vaginal bleeding.or vaginal dryness. See PCP every 3 months. Still working on cholesterol with medication use control.  Son lost home in flood, so trying to help with all the devastation. Exercising has increased hoping will help with cholesterol. No health concerns today. Biked 14 miles in the [place]!! Recheck BP 110/62.  Patient's last menstrual period was 08/17/1998.          Sexually active: No.  The current method of family planning is vasectomy.    Exercising: Yes.    bikes 35mles 2x/week. Smoker:  no  Health Maintenance: Pap:  05-29-14 Neg:Neg HR HPV MMG:  06-22-14 Density Cat.B/Neg/BiRads1:[facility] Colonoscopy:  NEVER plans to have done BMD:   2015 Osteopenia--at higher risk factor for bone fracture due to spinal injections. TDaP:  Up to date with PCP Labs: PCP   reports that she has never smoked. She has never used smokeless tobacco. She reports that she does not drink alcohol or use illicit drugs.  Past Medical History  Diagnosis Date  . Osteopenia   . Hypercholesteremia   . Ulcer   . Torn rotator cuff   . Torn ACL     & meniscus  . Bulging lumbar disc     Past Surgical History  Procedure Laterality Date  . Appendectomy      Current Outpatient Prescriptions  Medication Sig Dispense Refill  . calcium carbonate (OS-CAL) 600 MG TABS tablet Take 600 mg by mouth daily. Takes 3 (?) 6087mof calcium daily**    . pravastatin (PRAVACHOL) 40 MG tablet Take 20 mg by mouth daily.     . Marland KitchenITAMIN D, ERGOCALCIFEROL, PO Take 1 tablet by mouth daily. Takes 3 tablets of 80012maily for a total of 2400m61m    No current facility-administered medications for this visit.    Family History  Problem Relation Age of Onset  . Multiple myeloma Mother   . Cancer Father     throat  . Heart attack Father   . Cancer Daughter     ovarian   . Heart disease Brother     ROS:  Pertinent items are noted in HPI.  Otherwise, a comprehensive ROS was negative.  Exam:   BP 140/92 mmHg  Pulse 70  Resp 18  Ht 5' 6"  (1.676 m)  Wt 178 lb (80.74 kg)  BMI 28.74 kg/m2  LMP 08/17/1998 Height: 5' 6"  (167.6 cm) Ht Readings from Last 3 Encounters:  06/13/15 5' 6"  (1.676 m)  09/20/14 5' 6"  (1.676 m)  05/29/14 5' 2"  (1.575 m)    General appearance: alert, cooperative and appears stated age Head: Normocephalic, without obvious abnormality, atraumatic Neck: no adenopathy, supple, symmetrical, trachea midline and thyroid normal to inspection and palpation Lungs: clear to auscultation bilaterally Breasts: normal appearance, no masses or tenderness, No nipple retraction or dimpling, No nipple discharge or bleeding, No axillary or supraclavicular adenopathy Heart: regular rate and rhythm Abdomen: soft, non-tender; no masses,  no organomegaly Extremities: extremities normal, atraumatic, no cyanosis or edema Skin: Skin color, texture, turgor normal. No rashes or lesions Lymph nodes: Cervical, supraclavicular, and axillary nodes normal. No abnormal inguinal nodes palpated Neurologic: Grossly normal   Pelvic: External genitalia:  no lesions              Urethra:  normal appearing urethra with no masses, tenderness or lesions  Bartholin's and Skene's: normal                 Vagina: normal appearing vagina with normal color and discharge, no lesions              Cervix: normal,non tender,no lesions              Pap taken: No. Bimanual Exam:  Uterus:  normal size, contour, position, consistency, mobility, non-tender              Adnexa: normal adnexa and no mass, fullness, tenderness               Rectovaginal: Confirms               Anus:  normal sphincter tone, no lesions    A:  Well Woman with normal exam  Menopausal no HRT  Hyperlipidemia with PCP management   P:   Reviewed health and wellness pertinent to exam  Aware  of need to evaluate if bleeding  Continue with MD as indicated  Pap smear as above not taken   counseled on breast self exam, mammography screening, adequate intake of calcium and vitamin D, diet and exercise, Kegel's exercises, Discussed risks and benefits of colonoscopy again and declines!! return annually or prn  An After Visit Summary was printed and given to the patient.

## 2015-06-13 NOTE — Progress Notes (Signed)
Reviewed personally.  M. Suzanne Chelbie Jarnagin, MD.  

## 2015-06-13 NOTE — Patient Instructions (Signed)

## 2015-07-08 ENCOUNTER — Encounter (INDEPENDENT_AMBULATORY_CARE_PROVIDER_SITE_OTHER): Payer: Self-pay | Admitting: *Deleted

## 2015-11-07 ENCOUNTER — Encounter (INDEPENDENT_AMBULATORY_CARE_PROVIDER_SITE_OTHER): Payer: Self-pay | Admitting: *Deleted

## 2015-11-08 ENCOUNTER — Other Ambulatory Visit (INDEPENDENT_AMBULATORY_CARE_PROVIDER_SITE_OTHER): Payer: Self-pay | Admitting: *Deleted

## 2015-11-08 DIAGNOSIS — Z1211 Encounter for screening for malignant neoplasm of colon: Secondary | ICD-10-CM

## 2015-12-18 ENCOUNTER — Other Ambulatory Visit (INDEPENDENT_AMBULATORY_CARE_PROVIDER_SITE_OTHER): Payer: Self-pay | Admitting: *Deleted

## 2015-12-18 ENCOUNTER — Encounter (INDEPENDENT_AMBULATORY_CARE_PROVIDER_SITE_OTHER): Payer: Self-pay | Admitting: *Deleted

## 2015-12-18 MED ORDER — PEG 3350-KCL-NA BICARB-NACL 420 G PO SOLR: 4000.0000 mL | Freq: Once | ORAL | Status: DC

## 2015-12-18 NOTE — Telephone Encounter (Signed)
Patient needs trilyte 

## 2015-12-24 ENCOUNTER — Telehealth (INDEPENDENT_AMBULATORY_CARE_PROVIDER_SITE_OTHER): Payer: Self-pay | Admitting: *Deleted

## 2015-12-24 NOTE — Telephone Encounter (Signed)
Referring MD/PCP: hall   Procedure: tcs  Reason/Indication:  screening  Has patient had this procedure before?  no  If so, when, by whom and where?    Is there a family history of colon cancer?  no  Who?  What age when diagnosed?    Is patient diabetic?   no      Does patient have prosthetic heart valve or mechanical valve?  no  Do you have a pacemaker?  no  Has patient ever had endocarditis? no  Has patient had joint replacement within last 12 months?  no  Does patient tend to be constipated or take laxatives? no  Does patient have a history of alcohol/drug use?  no  Is patient on Coumadin, Plavix and/or Aspirin? yes  Medications: goody powder daily, fibercor 105 mg daily, calcium, vit d3, zyrtec 10 mg daily  Allergies: codeine  Medication Adjustment: goody powder 2 days  Procedure date & time: 01/22/16 at 1015

## 2015-12-26 NOTE — Telephone Encounter (Signed)
agree

## 2016-01-02 ENCOUNTER — Other Ambulatory Visit (INDEPENDENT_AMBULATORY_CARE_PROVIDER_SITE_OTHER): Payer: Self-pay | Admitting: *Deleted

## 2016-01-02 NOTE — Telephone Encounter (Signed)
Patient needs movi prep 

## 2016-01-07 MED ORDER — PEG-KCL-NACL-NASULF-NA ASC-C 100 G PO SOLR: 1.0000 | Freq: Once | ORAL | Status: DC

## 2016-01-09 ENCOUNTER — Encounter (INDEPENDENT_AMBULATORY_CARE_PROVIDER_SITE_OTHER): Payer: Self-pay | Admitting: *Deleted

## 2016-01-22 ENCOUNTER — Encounter (HOSPITAL_COMMUNITY)
Admission: RE | Disposition: A | Discharge status: Home / Self Care | Payer: Self-pay | Source: Ambulatory Visit | Attending: Internal Medicine

## 2016-01-22 ENCOUNTER — Ambulatory Visit (HOSPITAL_COMMUNITY)
Admission: RE | Admit: 2016-01-22 | Discharge: 2016-01-22 | Disposition: A | Discharge status: Home / Self Care | Payer: PRIVATE HEALTH INSURANCE | Source: Ambulatory Visit | Attending: Internal Medicine | Admitting: Internal Medicine

## 2016-01-22 ENCOUNTER — Encounter (HOSPITAL_COMMUNITY): Payer: Self-pay | Admitting: *Deleted

## 2016-01-22 DIAGNOSIS — Z1211 Encounter for screening for malignant neoplasm of colon: Secondary | ICD-10-CM | POA: Diagnosis not present

## 2016-01-22 DIAGNOSIS — M8580 Other specified disorders of bone density and structure, unspecified site: Secondary | ICD-10-CM | POA: Diagnosis not present

## 2016-01-22 DIAGNOSIS — K5730 Diverticulosis of large intestine without perforation or abscess without bleeding: Secondary | ICD-10-CM | POA: Insufficient documentation

## 2016-01-22 DIAGNOSIS — E7800 Pure hypercholesterolemia, unspecified: Secondary | ICD-10-CM | POA: Diagnosis not present

## 2016-01-22 DIAGNOSIS — Z79899 Other long term (current) drug therapy: Secondary | ICD-10-CM | POA: Insufficient documentation

## 2016-01-22 HISTORY — DX: Other seasonal allergic rhinitis: J30.2

## 2016-01-22 HISTORY — PX: COLONOSCOPY: SHX5424

## 2016-01-22 SURGERY — COLONOSCOPY
Anesthesia: Moderate Sedation

## 2016-01-22 MED ORDER — MIDAZOLAM HCL 5 MG/5ML IJ SOLN
INTRAMUSCULAR | Status: AC
  Filled 2016-01-22: qty 10

## 2016-01-22 MED ORDER — SODIUM CHLORIDE 0.9 % IV SOLN
INTRAVENOUS | Status: DC
  Administered 2016-01-22: 10:00:00 via INTRAVENOUS

## 2016-01-22 MED ORDER — MEPERIDINE HCL 50 MG/ML IJ SOLN
INTRAMUSCULAR | Status: AC
  Filled 2016-01-22: qty 1

## 2016-01-22 MED ORDER — MEPERIDINE HCL 50 MG/ML IJ SOLN
INTRAMUSCULAR | Status: DC | PRN
  Administered 2016-01-22 (×2): 25 mg via INTRAVENOUS

## 2016-01-22 MED ORDER — STERILE WATER FOR IRRIGATION IR SOLN
Status: DC | PRN
  Administered 2016-01-22: 2.5 mL

## 2016-01-22 MED ORDER — MIDAZOLAM HCL 5 MG/5ML IJ SOLN
INTRAMUSCULAR | Status: DC | PRN
  Administered 2016-01-22: 1 mg via INTRAVENOUS
  Administered 2016-01-22 (×3): 2 mg via INTRAVENOUS
  Administered 2016-01-22: 1 mg via INTRAVENOUS

## 2016-01-22 NOTE — H&P (Signed)
Terri Robertson is an 65 y.o. female.   Chief Complaint: Patient is here for colonoscopy. HPI: Patient is 65 year old Caucasian female who is here for screening colonoscopy. She denies abdominal pain change in bowel habits or rectal bleeding. This is patient's first exam. Family history is negative for CRC.  Past Medical History  Diagnosis Date  . Osteopenia   . Hypercholesteremia   . Ulcer   . Torn rotator cuff   . Torn ACL     & meniscus  . Bulging lumbar disc   . Seasonal allergies     Past Surgical History  Procedure Laterality Date  . Appendectomy      Family History  Problem Relation Age of Onset  . Multiple myeloma Mother   . Cancer Father     throat  . Heart attack Father   . Cancer Daughter     ovarian  . Heart disease Brother    Social History:  reports that she has never smoked. She has never used smokeless tobacco. She reports that she does not drink alcohol or use illicit drugs.  Allergies:  Allergies  Allergen Reactions  . Codeine Nausea Only    "head spinning"    Medications Prior to Admission  Medication Sig Dispense Refill  . Calcium Carb-Cholecalciferol (CALCIUM 600/VITAMIN D3) 600-800 MG-UNIT TABS Take 3 tablets by mouth daily.    . cetirizine (ZYRTEC) 10 MG tablet Take 10 mg by mouth daily.    . fenofibrate (TRICOR) 145 MG tablet Take 105 mg by mouth every other day.    . peg 3350 powder (MOVIPREP) 100 g SOLR Take 1 kit (200 g total) by mouth once. 1 kit 0  . pravastatin (PRAVACHOL) 20 MG tablet Take 20 mg by mouth daily.      No results found for this or any previous visit (from the past 48 hour(s)). No results found.  ROS  Blood pressure 135/83, pulse 72, temperature 97.6 F (36.4 C), temperature source Oral, resp. rate 16, height _0  (1.676 m), weight 165 lb (74.844 kg), last menstrual period 08/17/1998, SpO2 99 %. Physical Exam  Constitutional: She appears well-developed and well-nourished.  HENT:  Mouth/Throat: Oropharynx is clear  and moist.  Eyes: Conjunctivae are normal. No scleral icterus.  Neck: No thyromegaly present.  Cardiovascular: Normal rate, regular rhythm and normal heart sounds.   No murmur heard. Respiratory: Effort normal and breath sounds normal.  GI: Soft. She exhibits no distension and no mass. There is no tenderness.  Musculoskeletal: She exhibits no edema.  Lymphadenopathy:    She has no cervical adenopathy.  Neurological: She is alert.  Skin: Skin is warm and dry.     Assessment/Plan Average risk screening colonoscopy.  Hildred Laser, MD 01/22/2016, 10:24 AM

## 2016-01-22 NOTE — Discharge Instructions (Signed)
Resume usual medications and high fiber diet. No driving for 24 hours. Next screening exam in 10 years.  Diverticulosis Diverticulosis is the condition that develops when small pouches (diverticula) form in the wall of your colon. Your colon, or large intestine, is where water is absorbed and stool is formed. The pouches form when the inside layer of your colon pushes through weak spots in the outer layers of your colon. CAUSES  No one knows exactly what causes diverticulosis. RISK FACTORS  Being older than 18. Your risk for this condition increases with age. Diverticulosis is rare in people younger than 40 years. By age 31, almost everyone has it.  Eating a low-fiber diet.  Being frequently constipated.  Being overweight.  Not getting enough exercise.  Smoking.  Taking over-the-counter pain medicines, like aspirin and ibuprofen. SYMPTOMS  Most people with diverticulosis do not have symptoms. DIAGNOSIS  Because diverticulosis often has no symptoms, health care providers often discover the condition during an exam for other colon problems. In many cases, a health care provider will diagnose diverticulosis while using a flexible scope to examine the colon (colonoscopy). TREATMENT  If you have never developed an infection related to diverticulosis, you may not need treatment. If you have had an infection before, treatment may include:  Eating more fruits, vegetables, and grains.  Taking a fiber supplement.  Taking a live bacteria supplement (probiotic).  Taking medicine to relax your colon. HOME CARE INSTRUCTIONS   Drink at least 6-8 glasses of water each day to prevent constipation.  Try not to strain when you have a bowel movement.  Keep all follow-up appointments. If you have had an infection before:  Increase the fiber in your diet as directed by your health care provider or dietitian.  Take a dietary fiber supplement if your health care provider approves.  Only  take medicines as directed by your health care provider. SEEK MEDICAL CARE IF:   You have abdominal pain.  You have bloating.  You have cramps.  You have not gone to the bathroom in 3 days. SEEK IMMEDIATE MEDICAL CARE IF:   Your pain gets worse.  Yourbloating becomes very bad.  You have a fever or chills, and your symptoms suddenly get worse.  You begin vomiting.  You have bowel movements that are bloody or black. MAKE SURE YOU:  Understand these instructions.  Will watch your condition.  Will get help right away if you are not doing well or get worse.   This information is not intended to replace advice given to you by your health care provider. Make sure you discuss any questions you have with your health care provider.   Document Released: 04/30/2004 Document Revised: 08/08/2013 Document Reviewed: 06/28/2013 Elsevier Interactive Patient Education 2016 Elsevier Inc.   Colonoscopy, Care After Refer to this sheet in the next few weeks. These instructions provide you with information on caring for yourself after your procedure. Your health care provider may also give you more specific instructions. Your treatment has been planned according to current medical practices, but problems sometimes occur. Call your health care provider if you have any problems or questions after your procedure. WHAT TO EXPECT AFTER THE PROCEDURE  After your procedure, it is typical to have the following:  A small amount of blood in your stool.  Moderate amounts of gas and mild abdominal cramping or bloating. HOME CARE INSTRUCTIONS  Do not drive, operate machinery, or sign important documents for 24 hours.  You may shower and resume your  regular physical activities, but move at a slower pace for the first 24 hours.  Take frequent rest periods for the first 24 hours.  Walk around or put a warm pack on your abdomen to help reduce abdominal cramping and bloating.  Drink enough fluids to  keep your urine clear or pale yellow.  You may resume your normal diet as instructed by your health care provider. Avoid heavy or fried foods that are hard to digest.  Avoid drinking alcohol for 24 hours or as instructed by your health care provider.  Only take over-the-counter or prescription medicines as directed by your health care provider.  If a tissue sample (biopsy) was taken during your procedure:  Do not take aspirin or blood thinners for 7 days, or as instructed by your health care provider.  Do not drink alcohol for 7 days, or as instructed by your health care provider.  Eat soft foods for the first 24 hours. SEEK MEDICAL CARE IF: You have persistent spotting of blood in your stool 2-3 days after the procedure. SEEK IMMEDIATE MEDICAL CARE IF:  You have more than a small spotting of blood in your stool.  You pass large blood clots in your stool.  Your abdomen is swollen (distended).  You have nausea or vomiting.  You have a fever.  You have increasing abdominal pain that is not relieved with medicine.   This information is not intended to replace advice given to you by your health care provider. Make sure you discuss any questions you have with your health care provider.   Document Released: 03/17/2004 Document Revised: 05/24/2013 Document Reviewed: 04/10/2013 Elsevier Interactive Patient Education 2016 Elsevier Inc.   PATIENT INSTRUCTIONS POST-ANESTHESIA  IMMEDIATELY FOLLOWING SURGERY:  Do not drive or operate machinery for the first twenty four hours after surgery.  Do not make any important decisions for twenty four hours after surgery or while taking narcotic pain medications or sedatives.  If you develop intractable nausea and vomiting or a severe headache please notify your doctor immediately.  FOLLOW-UP:  Please make an appointment with your surgeon as instructed. You do not need to follow up with anesthesia unless specifically instructed to do so.  WOUND  CARE INSTRUCTIONS (if applicable):  Keep a dry clean dressing on the anesthesia/puncture wound site if there is drainage.  Once the wound has quit draining you may leave it open to air.  Generally you should leave the bandage intact for twenty four hours unless there is drainage.  If the epidural site drains for more than 36-48 hours please call the anesthesia department.  QUESTIONS?:  Please feel free to call your physician or the hospital operator if you have any questions, and they will be happy to assist you.

## 2016-01-22 NOTE — Op Note (Signed)
[facility] Patient Name: Terri Robertson Procedure Date: 01/22/2016 10:12 AM MRN: [id number] Date of Birth: 07-29-1951 Attending MD: Hildred Laser , MD CSN: [id number] Age: 65 Admit Type: Outpatient Procedure:                Colonoscopy Indications:              Screening for colorectal malignant neoplasm Providers:                Hildred Laser, MD, Lurline Del, RN, Isabella Stalling,                            Technician Referring MD:             Delphina Cahill, MD Medicines:                Meperidine 50 mg IV, Midazolam 8 mg IV Complications:            No immediate complications. Estimated Blood Loss:     Estimated blood loss: none. Procedure:                Pre-Anesthesia Assessment:                           - Prior to the procedure, a History and Physical                            was performed, and patient medications and                            allergies were reviewed. The patient's tolerance of                            previous anesthesia was also reviewed. The risks                            and benefits of the procedure and the sedation                            options and risks were discussed with the patient.                            All questions were answered, and informed consent                            was obtained. Prior Anticoagulants: The patient has                            taken no previous anticoagulant or antiplatelet                            agents. ASA Grade Assessment: I - A normal, healthy                            patient. After reviewing the risks and benefits,  the patient was deemed in satisfactory condition to                            undergo the procedure.                           After obtaining informed consent, the colonoscope                            was passed under direct vision. Throughout the                            procedure, the patient's blood pressure, pulse, and                             oxygen saturations were monitored continuously. The                            [id number]) was introduced through the                            anus and advanced to the the cecum, identified by                            appendiceal orifice and ileocecal valve. The                            colonoscopy was performed without difficulty. The                            patient tolerated the procedure well. The quality                            of the bowel preparation was good. The ileocecal                            valve, appendiceal orifice, and rectum were                            photographed. Scope In: 10:37:26 AM Scope Out: 11:01:00 AM Scope Withdrawal Time: 0 hours 9 minutes 28 seconds  Total Procedure Duration: 0 hours 23 minutes 34 seconds  Findings:      A few medium-mouthed diverticula were found in the sigmoid colon.      The exam was otherwise without abnormality on direct and retroflexion       views. Impression:               - Diverticulosis in the sigmoid colon.                           - The examination was otherwise normal on direct                            and retroflexion views.                           -  No specimens collected. Moderate Sedation:      Moderate (conscious) sedation was administered by the endoscopy nurse       and supervised by the endoscopist. The following parameters were       monitored: oxygen saturation, heart rate, blood pressure, CO2       capnography and response to care. Total physician intraservice time was       33 minutes. Recommendation:           - Patient has a contact number available for                            emergencies. The signs and symptoms of potential                            delayed complications were discussed with the                            patient. Return to normal activities tomorrow.                            Written discharge instructions were provided to the                             patient.                           - High fiber diet today.                           - Continue present medications.                           - Repeat colonoscopy in 10 years for screening                            purposes. Procedure Code(s):        --- Professional ---                           [phone], Colonoscopy, flexible; diagnostic, including                            collection of specimen(s) by brushing or washing,                            when performed (separate procedure)                           99152, Moderate sedation services provided by the                            same physician or other qualified health care                            professional performing the diagnostic or  therapeutic service that the sedation supports,                            requiring the presence of an independent trained                            observer to assist in the monitoring of the                            patient's level of consciousness and physiological                            status; initial 15 minutes of intraservice time,                            patient age 78 years or older                           [phone], Moderate sedation services; each additional                            15 minutes intraservice time Diagnosis Code(s):        --- Professional ---                           Z12.11, Encounter for screening for malignant                            neoplasm of colon                           K57.30, Diverticulosis of large intestine without                            perforation or abscess without bleeding CPT copyright 2016 American Medical Association. All rights reserved. The codes documented in this report are preliminary and upon coder review may  be revised to meet current compliance requirements. Hildred Laser, MD Hildred Laser, MD 01/22/2016 11:07:36 AM This report has been signed electronically. Number of Addenda: 0

## 2016-01-23 ENCOUNTER — Encounter (HOSPITAL_COMMUNITY): Payer: Self-pay | Admitting: Internal Medicine

## 2016-04-28 DIAGNOSIS — D225 Melanocytic nevi of trunk: Secondary | ICD-10-CM | POA: Diagnosis not present

## 2016-04-28 DIAGNOSIS — B078 Other viral warts: Secondary | ICD-10-CM | POA: Diagnosis not present

## 2016-04-28 DIAGNOSIS — Z1283 Encounter for screening for malignant neoplasm of skin: Secondary | ICD-10-CM | POA: Diagnosis not present

## 2016-04-28 DIAGNOSIS — L723 Sebaceous cyst: Secondary | ICD-10-CM | POA: Diagnosis not present

## 2016-05-19 DIAGNOSIS — R079 Chest pain, unspecified: Secondary | ICD-10-CM | POA: Diagnosis not present

## 2016-05-19 DIAGNOSIS — R600 Localized edema: Secondary | ICD-10-CM | POA: Diagnosis not present

## 2016-05-19 DIAGNOSIS — R05 Cough: Secondary | ICD-10-CM | POA: Diagnosis not present

## 2016-05-26 DIAGNOSIS — R601 Generalized edema: Secondary | ICD-10-CM | POA: Diagnosis not present

## 2016-05-26 DIAGNOSIS — Z6829 Body mass index (BMI) 29.0-29.9, adult: Secondary | ICD-10-CM | POA: Diagnosis not present

## 2016-06-09 DIAGNOSIS — M654 Radial styloid tenosynovitis [de Quervain]: Secondary | ICD-10-CM | POA: Diagnosis not present

## 2016-06-09 DIAGNOSIS — M25532 Pain in left wrist: Secondary | ICD-10-CM | POA: Diagnosis not present

## 2016-06-18 ENCOUNTER — Ambulatory Visit: Payer: PRIVATE HEALTH INSURANCE | Admitting: Certified Nurse Midwife

## 2016-06-23 DIAGNOSIS — I482 Chronic atrial fibrillation: Secondary | ICD-10-CM | POA: Diagnosis not present

## 2016-06-23 DIAGNOSIS — D509 Iron deficiency anemia, unspecified: Secondary | ICD-10-CM | POA: Diagnosis not present

## 2016-06-23 DIAGNOSIS — I10 Essential (primary) hypertension: Secondary | ICD-10-CM | POA: Diagnosis not present

## 2016-06-23 DIAGNOSIS — E782 Mixed hyperlipidemia: Secondary | ICD-10-CM | POA: Diagnosis not present

## 2016-06-23 DIAGNOSIS — E785 Hyperlipidemia, unspecified: Secondary | ICD-10-CM | POA: Diagnosis not present

## 2016-06-23 DIAGNOSIS — E119 Type 2 diabetes mellitus without complications: Secondary | ICD-10-CM | POA: Diagnosis not present

## 2016-06-23 DIAGNOSIS — E039 Hypothyroidism, unspecified: Secondary | ICD-10-CM | POA: Diagnosis not present

## 2016-06-23 DIAGNOSIS — R7301 Impaired fasting glucose: Secondary | ICD-10-CM | POA: Diagnosis not present

## 2016-06-27 DIAGNOSIS — B070 Plantar wart: Secondary | ICD-10-CM | POA: Diagnosis not present

## 2016-06-27 DIAGNOSIS — E782 Mixed hyperlipidemia: Secondary | ICD-10-CM | POA: Diagnosis not present

## 2016-06-27 DIAGNOSIS — Z6830 Body mass index (BMI) 30.0-30.9, adult: Secondary | ICD-10-CM | POA: Diagnosis not present

## 2016-06-27 DIAGNOSIS — Z0000 Encounter for general adult medical examination without abnormal findings: Secondary | ICD-10-CM | POA: Diagnosis not present

## 2016-07-02 ENCOUNTER — Telehealth: Payer: Self-pay | Admitting: Certified Nurse Midwife

## 2016-07-02 ENCOUNTER — Ambulatory Visit: Payer: PRIVATE HEALTH INSURANCE | Admitting: Certified Nurse Midwife

## 2016-07-02 NOTE — Telephone Encounter (Signed)
Patient canceled her appointment for today for her annual. She will be having her annual at her primary doctors office due to insurance issues.

## 2016-07-02 NOTE — Progress Notes (Deleted)
65 y.o. G86P3003 Married  Caucasian Fe here for annual exam.    Patient's last menstrual period was 08/17/1998.          Sexually active: {yes no:314532}  The current method of family planning is {contraception:315051}.    Exercising: {yes no:314532}  {types:19826} Smoker:  {YES NO:22349}  Health Maintenance: Pap:  05-29-14 neg HPV HR neg MMG:  *** Colonoscopy: never plans to have one BMD:   2015 osteopenia TDaP:  2013 Shingles: *** Pneumonia: *** Hep C and HIV: *** Labs: ***   reports that she has never smoked. She has never used smokeless tobacco. She reports that she does not drink alcohol or use drugs.  Past Medical History:  Diagnosis Date  . Bulging lumbar disc   . Hypercholesteremia   . Osteopenia   . Seasonal allergies   . Torn ACL    & meniscus  . Torn rotator cuff   . Ulcer [facility])     Past Surgical History:  Procedure Laterality Date  . APPENDECTOMY    . COLONOSCOPY N/A 01/22/2016   Procedure: COLONOSCOPY;  Surgeon: Rogene Houston, MD;  Location: [facility] ENDO SUITE;  Service: Endoscopy;  Laterality: N/A;  1015    Current Outpatient Prescriptions  Medication Sig Dispense Refill  . Calcium Carb-Cholecalciferol (CALCIUM 600/VITAMIN D3) 600-800 MG-UNIT TABS Take 3 tablets by mouth daily.    . cetirizine (ZYRTEC) 10 MG tablet Take 10 mg by mouth daily.    . fenofibrate (TRICOR) 145 MG tablet Take 105 mg by mouth every other day.     No current facility-administered medications for this visit.     Family History  Problem Relation Age of Onset  . Multiple myeloma Mother   . Cancer Father     throat  . Heart attack Father   . Cancer Daughter     ovarian  . Heart disease Brother     ROS:  Pertinent items are noted in HPI.  Otherwise, a comprehensive ROS was negative.  Exam:   LMP 08/17/1998    Ht Readings from Last 3 Encounters:  01/22/16 '5\' 6"'$  (1.676 m)  06/13/15 '5\' 6"'$  (1.676 m)  09/20/14 '5\' 6"'$  (1.676 m)    General appearance: alert, cooperative and  appears stated age Head: Normocephalic, without obvious abnormality, atraumatic Neck: no adenopathy, supple, symmetrical, trachea midline and thyroid {EXAM; THYROID:18604} Lungs: clear to auscultation bilaterally Breasts: {Exam; breast:13139::"normal appearance, no masses or tenderness"} Heart: regular rate and rhythm Abdomen: soft, non-tender; no masses,  no organomegaly Extremities: extremities normal, atraumatic, no cyanosis or edema Skin: Skin color, texture, turgor normal. No rashes or lesions Lymph nodes: Cervical, supraclavicular, and axillary nodes normal. No abnormal inguinal nodes palpated Neurologic: Grossly normal   Pelvic: External genitalia:  no lesions              Urethra:  normal appearing urethra with no masses, tenderness or lesions              Bartholin's and Skene's: normal                 Vagina: normal appearing vagina with normal color and discharge, no lesions              Cervix: {exam; cervix:14595}              Pap taken: {yes no:314532} Bimanual Exam:  Uterus:  {exam; uterus:12215}              Adnexa: {exam; adnexa:12223}  Rectovaginal: Confirms               Anus:  normal sphincter tone, no lesions  Chaperone present: ***  A:  Well Woman with normal exam  P:   Reviewed health and wellness pertinent to exam  Pap smear as above  {plan; gyn:5269::"mammogram","pap smear","return annually or prn"}  An After Visit Summary was printed and given to the patient.

## 2016-07-21 DIAGNOSIS — Z23 Encounter for immunization: Secondary | ICD-10-CM | POA: Diagnosis not present

## 2016-08-06 DIAGNOSIS — J060 Acute laryngopharyngitis: Secondary | ICD-10-CM | POA: Diagnosis not present

## 2016-10-27 DIAGNOSIS — M654 Radial styloid tenosynovitis [de Quervain]: Secondary | ICD-10-CM | POA: Diagnosis not present

## 2016-10-27 DIAGNOSIS — M25532 Pain in left wrist: Secondary | ICD-10-CM | POA: Diagnosis not present

## 2017-02-09 DIAGNOSIS — M654 Radial styloid tenosynovitis [de Quervain]: Secondary | ICD-10-CM | POA: Diagnosis not present

## 2017-03-19 DIAGNOSIS — Z809 Family history of malignant neoplasm, unspecified: Secondary | ICD-10-CM | POA: Diagnosis not present

## 2017-03-19 DIAGNOSIS — M654 Radial styloid tenosynovitis [de Quervain]: Secondary | ICD-10-CM | POA: Diagnosis not present

## 2017-03-19 DIAGNOSIS — Z8249 Family history of ischemic heart disease and other diseases of the circulatory system: Secondary | ICD-10-CM | POA: Diagnosis not present

## 2017-04-27 DIAGNOSIS — M25561 Pain in right knee: Secondary | ICD-10-CM | POA: Diagnosis not present

## 2017-06-03 DIAGNOSIS — Z23 Encounter for immunization: Secondary | ICD-10-CM | POA: Diagnosis not present

## 2017-06-24 DIAGNOSIS — E782 Mixed hyperlipidemia: Secondary | ICD-10-CM | POA: Diagnosis not present

## 2017-06-29 DIAGNOSIS — Z0000 Encounter for general adult medical examination without abnormal findings: Secondary | ICD-10-CM | POA: Diagnosis not present

## 2017-06-29 DIAGNOSIS — M25552 Pain in left hip: Secondary | ICD-10-CM | POA: Diagnosis not present

## 2017-06-29 DIAGNOSIS — M25561 Pain in right knee: Secondary | ICD-10-CM | POA: Diagnosis not present

## 2017-06-29 DIAGNOSIS — E782 Mixed hyperlipidemia: Secondary | ICD-10-CM | POA: Diagnosis not present

## 2017-06-30 ENCOUNTER — Other Ambulatory Visit (HOSPITAL_COMMUNITY): Payer: Self-pay | Admitting: Internal Medicine

## 2017-06-30 DIAGNOSIS — Z1231 Encounter for screening mammogram for malignant neoplasm of breast: Secondary | ICD-10-CM

## 2017-06-30 DIAGNOSIS — Z780 Asymptomatic menopausal state: Secondary | ICD-10-CM

## 2017-07-01 DIAGNOSIS — M1711 Unilateral primary osteoarthritis, right knee: Secondary | ICD-10-CM | POA: Diagnosis not present

## 2017-07-01 DIAGNOSIS — S83241A Other tear of medial meniscus, current injury, right knee, initial encounter: Secondary | ICD-10-CM | POA: Diagnosis not present

## 2017-07-12 ENCOUNTER — Inpatient Hospital Stay (HOSPITAL_COMMUNITY)
Admission: RE | Admit: 2017-07-12 | Discharge: 2017-07-12 | Disposition: A | Discharge status: Home / Self Care | Payer: PRIVATE HEALTH INSURANCE | Source: Ambulatory Visit | Attending: Internal Medicine | Admitting: Internal Medicine

## 2017-07-12 ENCOUNTER — Ambulatory Visit (HOSPITAL_COMMUNITY): Payer: PRIVATE HEALTH INSURANCE

## 2017-07-12 ENCOUNTER — Encounter (HOSPITAL_COMMUNITY): Payer: Self-pay

## 2017-07-29 DIAGNOSIS — M1711 Unilateral primary osteoarthritis, right knee: Secondary | ICD-10-CM | POA: Diagnosis not present

## 2017-07-29 DIAGNOSIS — S83241A Other tear of medial meniscus, current injury, right knee, initial encounter: Secondary | ICD-10-CM | POA: Diagnosis not present

## 2017-07-31 DIAGNOSIS — M25561 Pain in right knee: Secondary | ICD-10-CM | POA: Diagnosis not present

## 2017-08-05 DIAGNOSIS — M1711 Unilateral primary osteoarthritis, right knee: Secondary | ICD-10-CM | POA: Diagnosis not present

## 2017-08-11 ENCOUNTER — Other Ambulatory Visit: Payer: Self-pay | Admitting: Internal Medicine

## 2017-08-11 DIAGNOSIS — Z6830 Body mass index (BMI) 30.0-30.9, adult: Secondary | ICD-10-CM | POA: Diagnosis not present

## 2017-08-11 DIAGNOSIS — R252 Cramp and spasm: Secondary | ICD-10-CM | POA: Diagnosis not present

## 2017-08-13 ENCOUNTER — Other Ambulatory Visit (HOSPITAL_COMMUNITY): Payer: Self-pay | Admitting: Internal Medicine

## 2017-08-13 ENCOUNTER — Other Ambulatory Visit (HOSPITAL_COMMUNITY): Payer: Self-pay | Admitting: Pulmonary Disease

## 2017-08-13 DIAGNOSIS — M79662 Pain in left lower leg: Principal | ICD-10-CM

## 2017-08-13 DIAGNOSIS — M79661 Pain in right lower leg: Secondary | ICD-10-CM

## 2017-08-13 DIAGNOSIS — M79604 Pain in right leg: Secondary | ICD-10-CM

## 2017-08-13 DIAGNOSIS — M79605 Pain in left leg: Principal | ICD-10-CM

## 2017-08-16 DIAGNOSIS — M1711 Unilateral primary osteoarthritis, right knee: Secondary | ICD-10-CM | POA: Diagnosis not present

## 2017-08-24 ENCOUNTER — Ambulatory Visit (HOSPITAL_COMMUNITY)
Admission: RE | Admit: 2017-08-24 | Discharge: 2017-08-24 | Disposition: A | Discharge status: Home / Self Care | Payer: Medicare Other | Source: Ambulatory Visit | Attending: Pulmonary Disease | Admitting: Pulmonary Disease

## 2017-08-24 DIAGNOSIS — M79661 Pain in right lower leg: Secondary | ICD-10-CM | POA: Diagnosis not present

## 2017-08-24 DIAGNOSIS — M79662 Pain in left lower leg: Secondary | ICD-10-CM | POA: Diagnosis not present

## 2017-08-24 DIAGNOSIS — M1711 Unilateral primary osteoarthritis, right knee: Secondary | ICD-10-CM | POA: Diagnosis not present

## 2017-08-25 ENCOUNTER — Other Ambulatory Visit (HOSPITAL_COMMUNITY): Payer: Self-pay | Admitting: Internal Medicine

## 2017-08-25 DIAGNOSIS — M79661 Pain in right lower leg: Secondary | ICD-10-CM

## 2017-08-25 DIAGNOSIS — M79662 Pain in left lower leg: Principal | ICD-10-CM

## 2017-08-31 DIAGNOSIS — R252 Cramp and spasm: Secondary | ICD-10-CM | POA: Diagnosis not present

## 2017-08-31 DIAGNOSIS — Z6830 Body mass index (BMI) 30.0-30.9, adult: Secondary | ICD-10-CM | POA: Diagnosis not present

## 2017-10-12 DIAGNOSIS — H6500 Acute serous otitis media, unspecified ear: Secondary | ICD-10-CM | POA: Diagnosis not present

## 2017-10-12 DIAGNOSIS — J029 Acute pharyngitis, unspecified: Secondary | ICD-10-CM | POA: Diagnosis not present

## 2017-10-26 DIAGNOSIS — M1711 Unilateral primary osteoarthritis, right knee: Secondary | ICD-10-CM | POA: Diagnosis not present

## 2017-12-30 DIAGNOSIS — I10 Essential (primary) hypertension: Secondary | ICD-10-CM | POA: Diagnosis not present

## 2017-12-30 DIAGNOSIS — E782 Mixed hyperlipidemia: Secondary | ICD-10-CM | POA: Diagnosis not present

## 2018-01-04 DIAGNOSIS — Z6832 Body mass index (BMI) 32.0-32.9, adult: Secondary | ICD-10-CM | POA: Diagnosis not present

## 2018-01-04 DIAGNOSIS — G2581 Restless legs syndrome: Secondary | ICD-10-CM | POA: Diagnosis not present

## 2018-01-04 DIAGNOSIS — E782 Mixed hyperlipidemia: Secondary | ICD-10-CM | POA: Diagnosis not present

## 2018-06-21 DIAGNOSIS — Z23 Encounter for immunization: Secondary | ICD-10-CM | POA: Diagnosis not present

## 2018-06-30 DIAGNOSIS — E782 Mixed hyperlipidemia: Secondary | ICD-10-CM | POA: Diagnosis not present

## 2018-06-30 DIAGNOSIS — Z808 Family history of malignant neoplasm of other organs or systems: Secondary | ICD-10-CM | POA: Diagnosis not present

## 2018-07-05 DIAGNOSIS — Z0000 Encounter for general adult medical examination without abnormal findings: Secondary | ICD-10-CM | POA: Diagnosis not present

## 2018-07-05 DIAGNOSIS — Z23 Encounter for immunization: Secondary | ICD-10-CM | POA: Diagnosis not present

## 2018-07-05 DIAGNOSIS — G2581 Restless legs syndrome: Secondary | ICD-10-CM | POA: Diagnosis not present

## 2018-07-05 DIAGNOSIS — E782 Mixed hyperlipidemia: Secondary | ICD-10-CM | POA: Diagnosis not present

## 2018-07-05 DIAGNOSIS — M25561 Pain in right knee: Secondary | ICD-10-CM | POA: Diagnosis not present

## 2018-07-05 DIAGNOSIS — E875 Hyperkalemia: Secondary | ICD-10-CM | POA: Diagnosis not present

## 2018-07-28 ENCOUNTER — Other Ambulatory Visit (HOSPITAL_COMMUNITY): Payer: Self-pay | Admitting: Internal Medicine

## 2018-07-28 DIAGNOSIS — Z1231 Encounter for screening mammogram for malignant neoplasm of breast: Secondary | ICD-10-CM

## 2018-07-28 DIAGNOSIS — Z780 Asymptomatic menopausal state: Secondary | ICD-10-CM

## 2018-08-25 ENCOUNTER — Ambulatory Visit (HOSPITAL_COMMUNITY)
Admission: RE | Admit: 2018-08-25 | Discharge: 2018-08-25 | Disposition: A | Discharge status: Home / Self Care | Payer: Medicare Other | Source: Ambulatory Visit | Attending: Internal Medicine | Admitting: Internal Medicine

## 2018-08-25 DIAGNOSIS — M85852 Other specified disorders of bone density and structure, left thigh: Secondary | ICD-10-CM | POA: Diagnosis not present

## 2018-08-25 DIAGNOSIS — Z1231 Encounter for screening mammogram for malignant neoplasm of breast: Secondary | ICD-10-CM | POA: Diagnosis not present

## 2018-08-25 DIAGNOSIS — Z780 Asymptomatic menopausal state: Secondary | ICD-10-CM | POA: Insufficient documentation

## 2018-08-26 ENCOUNTER — Other Ambulatory Visit (HOSPITAL_COMMUNITY): Payer: Self-pay | Admitting: Internal Medicine

## 2018-08-26 DIAGNOSIS — R928 Other abnormal and inconclusive findings on diagnostic imaging of breast: Secondary | ICD-10-CM

## 2018-09-13 DIAGNOSIS — H43393 Other vitreous opacities, bilateral: Secondary | ICD-10-CM | POA: Diagnosis not present

## 2018-10-04 DIAGNOSIS — J101 Influenza due to other identified influenza virus with other respiratory manifestations: Secondary | ICD-10-CM | POA: Diagnosis not present

## 2019-02-06 DIAGNOSIS — E785 Hyperlipidemia, unspecified: Secondary | ICD-10-CM | POA: Diagnosis not present

## 2019-02-06 DIAGNOSIS — R7309 Other abnormal glucose: Secondary | ICD-10-CM | POA: Diagnosis not present

## 2019-02-06 DIAGNOSIS — R928 Other abnormal and inconclusive findings on diagnostic imaging of breast: Secondary | ICD-10-CM | POA: Diagnosis not present

## 2019-02-06 DIAGNOSIS — Z7689 Persons encountering health services in other specified circumstances: Secondary | ICD-10-CM | POA: Diagnosis not present

## 2019-02-06 DIAGNOSIS — Z139 Encounter for screening, unspecified: Secondary | ICD-10-CM | POA: Diagnosis not present

## 2019-02-06 DIAGNOSIS — M85852 Other specified disorders of bone density and structure, left thigh: Secondary | ICD-10-CM | POA: Diagnosis not present

## 2019-02-16 DIAGNOSIS — R7309 Other abnormal glucose: Secondary | ICD-10-CM | POA: Diagnosis not present

## 2019-02-16 DIAGNOSIS — Z5320 Procedure and treatment not carried out because of patient's decision for unspecified reasons: Secondary | ICD-10-CM | POA: Diagnosis not present

## 2019-02-16 DIAGNOSIS — Z114 Encounter for screening for human immunodeficiency virus [HIV]: Secondary | ICD-10-CM | POA: Diagnosis not present

## 2019-02-16 DIAGNOSIS — Z139 Encounter for screening, unspecified: Secondary | ICD-10-CM | POA: Diagnosis not present

## 2019-02-16 DIAGNOSIS — E785 Hyperlipidemia, unspecified: Secondary | ICD-10-CM | POA: Diagnosis not present

## 2019-03-20 ENCOUNTER — Other Ambulatory Visit: Payer: Self-pay

## 2019-03-29 DIAGNOSIS — R197 Diarrhea, unspecified: Secondary | ICD-10-CM | POA: Diagnosis not present

## 2019-03-29 DIAGNOSIS — R0981 Nasal congestion: Secondary | ICD-10-CM | POA: Diagnosis not present

## 2019-03-29 DIAGNOSIS — J029 Acute pharyngitis, unspecified: Secondary | ICD-10-CM | POA: Diagnosis not present

## 2019-03-29 DIAGNOSIS — Z20828 Contact with and (suspected) exposure to other viral communicable diseases: Secondary | ICD-10-CM | POA: Diagnosis not present

## 2019-03-31 ENCOUNTER — Encounter: Payer: Self-pay | Admitting: Certified Nurse Midwife

## 2019-03-31 DIAGNOSIS — R928 Other abnormal and inconclusive findings on diagnostic imaging of breast: Secondary | ICD-10-CM | POA: Diagnosis not present

## 2019-06-06 DIAGNOSIS — Z23 Encounter for immunization: Secondary | ICD-10-CM | POA: Diagnosis not present

## 2019-07-10 DIAGNOSIS — Z9189 Other specified personal risk factors, not elsewhere classified: Secondary | ICD-10-CM | POA: Diagnosis not present

## 2019-07-10 DIAGNOSIS — Z20828 Contact with and (suspected) exposure to other viral communicable diseases: Secondary | ICD-10-CM | POA: Diagnosis not present

## 2019-09-12 DIAGNOSIS — E785 Hyperlipidemia, unspecified: Secondary | ICD-10-CM | POA: Diagnosis not present

## 2019-09-12 DIAGNOSIS — R7309 Other abnormal glucose: Secondary | ICD-10-CM | POA: Diagnosis not present

## 2019-09-21 DIAGNOSIS — Z23 Encounter for immunization: Secondary | ICD-10-CM | POA: Diagnosis not present

## 2019-09-21 DIAGNOSIS — R7303 Prediabetes: Secondary | ICD-10-CM | POA: Diagnosis not present

## 2019-09-21 DIAGNOSIS — E785 Hyperlipidemia, unspecified: Secondary | ICD-10-CM | POA: Diagnosis not present

## 2019-09-21 DIAGNOSIS — H9313 Tinnitus, bilateral: Secondary | ICD-10-CM | POA: Diagnosis not present

## 2019-10-02 ENCOUNTER — Other Ambulatory Visit: Payer: Self-pay

## 2019-10-02 ENCOUNTER — Encounter: Payer: Self-pay | Admitting: Internal Medicine

## 2019-10-02 ENCOUNTER — Ambulatory Visit (INDEPENDENT_AMBULATORY_CARE_PROVIDER_SITE_OTHER): Payer: Medicare Other | Admitting: Internal Medicine

## 2019-10-02 ENCOUNTER — Ambulatory Visit (INDEPENDENT_AMBULATORY_CARE_PROVIDER_SITE_OTHER): Payer: Medicare Other | Admitting: Otolaryngology

## 2019-10-02 VITALS — Temp 95.2°F

## 2019-10-02 VITALS — BP 126/78 | HR 70 | Temp 97.8°F | Ht 66.0 in | Wt 198.0 lb

## 2019-10-02 DIAGNOSIS — H9311 Tinnitus, right ear: Secondary | ICD-10-CM

## 2019-10-02 DIAGNOSIS — E785 Hyperlipidemia, unspecified: Secondary | ICD-10-CM | POA: Diagnosis not present

## 2019-10-02 DIAGNOSIS — H6123 Impacted cerumen, bilateral: Secondary | ICD-10-CM | POA: Diagnosis not present

## 2019-10-02 NOTE — Progress Notes (Signed)
HPI: Terri Robertson is a 69 y.o. female who presents for evaluation of ringing in her ears right side much worse than left.  This has woken her up at night..  She has tried eardrops as well as Q-tips.  She has had history of wax problems in the past.  She has not had any hearing test in the past.  Denies loud noise exposure.  Past Medical History:  Diagnosis Date  . Bulging lumbar disc   . Hypercholesteremia   . Osteopenia   . Seasonal allergies   . Torn ACL    & meniscus  . Torn rotator cuff   . Ulcer    Past Surgical History:  Procedure Laterality Date  . APPENDECTOMY    . COLONOSCOPY N/A 01/22/2016   Procedure: COLONOSCOPY;  Surgeon: Rogene Houston, MD;  Location: [facility];  Service: Endoscopy;  Laterality: N/A;  1015   Social History   Socioeconomic History  . Marital status: Married    Spouse name: Not on file  . Number of children: Not on file  . Years of education: Not on file  . Highest education level: Not on file  Occupational History  . Not on file  Tobacco Use  . Smoking status: Never Smoker  . Smokeless tobacco: Never Used  Substance and Sexual Activity  . Alcohol use: No    Alcohol/week: 0.0 standard drinks  . Drug use: No  . Sexual activity: Not Currently    Partners: Male    Comment: husband vasectomy  Other Topics Concern  . Not on file  Social History Narrative  . Not on file   Social Determinants of Health   Financial Resource Strain:   . Difficulty of Paying Living Expenses: Not on file  Food Insecurity:   . Worried About Charity fundraiser in the Last Year: Not on file  . Ran Out of Food in the Last Year: Not on file  Transportation Needs:   . Lack of Transportation (Medical): Not on file  . Lack of Transportation (Non-Medical): Not on file  Physical Activity:   . Days of Exercise per Week: Not on file  . Minutes of Exercise per Session: Not on file  Stress:   . Feeling of Stress : Not on file  Social Connections:   . Frequency of  Communication with Friends and Family: Not on file  . Frequency of Social Gatherings with Friends and Family: Not on file  . Attends Religious Services: Not on file  . Active Member of Clubs or Organizations: Not on file  . Attends Archivist Meetings: Not on file  . Marital Status: Not on file   Family History  Problem Relation Age of Onset  . Multiple myeloma Mother   . Cancer Father        throat  . Heart attack Father   . Cancer Daughter        ovarian  . Heart disease Brother    Allergies  Allergen Reactions  . Codeine Nausea Only    "head spinning"   Prior to Admission medications   Medication Sig Start Date End Date Taking? Authorizing Provider  Calcium Carb-Cholecalciferol (CALCIUM 600/VITAMIN D3) 600-800 MG-UNIT TABS Take 3 tablets by mouth daily.   Yes [provider]  cetirizine (ZYRTEC) 10 MG tablet Take 10 mg by mouth daily.   Yes [provider]  fenofibrate (TRICOR) 145 MG tablet Take 105 mg by mouth every other day.   Yes [provider]  gabapentin (NEURONTIN) 300 MG capsule Take 300 mg by mouth at bedtime. 09/22/19  Yes [provider]  [place] 420 MG/3.5ML SOCT INJECT ONE PEN INTO THE SKIN EVERY MONTH 09/23/19  Yes [provider]     Positive ROS: Otherwise negative  All other systems have been reviewed and were otherwise negative with the exception of those mentioned in the HPI and as above.  Physical Exam: Constitutional: Alert, well-appearing, no acute distress Ears: External ears without lesions or tenderness. Ear canals: Right ear canal was totally occluded with cerumen adjacent to the TM.  Left ear canal had minimal cerumen only causing partial obstruction of the ear canal.  This was cleaned in the office using suction.  Both TMs were clear bilaterally.  After cleaning the ears she states that her ringing has resolved.. Nasal: External nose without lesions. Clear nasal passages Oral:  Oropharynx clear. Neck: No palpable adenopathy or masses Respiratory: Breathing comfortably  Skin: No facial/neck lesions or rash noted. She was scheduled for a hearing test but stated that she felt much better and will call us back for a hearing test if needed.  Cerumen impaction removal  Date/Time: 10/02/2019 2:52 PM Performed by: Rozetta Nunnery, MD Authorized by: Rozetta Nunnery, MD   Consent:    Consent obtained:  Verbal   Consent given by:  Patient   Risks discussed:  Pain and bleeding Procedure details:    Location:  L ear and R ear   Procedure type: suction   Post-procedure details:    Inspection:  TM intact and canal normal   Hearing quality:  Improved   Patient tolerance of procedure:  Tolerated well, no immediate complications Comments:     TMs were clear bilaterally.    Assessment: Cerumen obstruction with secondary tinnitus.  Plan: This was cleaned in the office with resolution of her tinnitus.  Radene Journey, MD

## 2019-10-02 NOTE — Patient Instructions (Signed)
Medication Instructions:  Your physician recommends that you continue on your current medications as directed. Please refer to the Current Medication list given to you today.  *If you need a refill on your cardiac medications before your next appointment, please call your pharmacy*  Lab Work: NONE   If you have labs (blood work) drawn today and your tests are completely normal, you will receive your results only by: Marland Kitchen MyChart Message (if you have MyChart) OR . A paper copy in the mail If you have any lab test that is abnormal or we need to change your treatment, we will call you to review the results.  Testing/Procedures: NONE   Follow-Up: At [facility], you and your health needs are our priority.  As part of our continuing mission to provide you with exceptional heart care, we have created designated Provider Care Teams.  These Care Teams include your primary Cardiologist (physician) and Advanced Practice Providers (APPs -  Physician Assistants and Nurse Practitioners) who all work together to provide you with the care you need, when you need it.  Your next appointment:    To Be Determined   The format for your next appointment:   Either In Person or Virtual  Provider:   Dorris Carnes, MD  Other Instructions Thank you for choosing [facility]!

## 2019-10-02 NOTE — Progress Notes (Signed)
Cardiology Office Note   Date:  10/02/2019   ID:  Terri, Robertson 05/11/1951, MRN [id number]  PCP:  Dara Lords, NP  Cardiologist:   Dorris Carnes, MD   Pt referred by B Dekoninck for lipids/Repatha approval    History of Present Illness: Terri Robertson is a 69 y.o. female with a history of chest pain in past   She was seen by Zandra Abts in 2015   Stress echo was done   She exercised for 10 min   Echo was normal   The pt also has allso a history of HL.   The pt had been on statins  in past but could not tolerate   [name] prescribed Repatha and it was approved initially   Lipids on Repatha improved significantly per pt report   (not sent)   Unfortunately, now not getting approval    Pt did have LE arterial dopplers in 2019 that sugg atherosclerosis.  She denies CP  Breathing is good  She is very active  Exercises regularly Working on diet to lose wt  Good wt for her is 170       Current Meds  Medication Sig  . Calcium Carb-Cholecalciferol (CALCIUM 600/VITAMIN D3) 600-800 MG-UNIT TABS Take 3 tablets by mouth daily.  . cetirizine (ZYRTEC) 10 MG tablet Take 10 mg by mouth daily.  . fenofibrate (TRICOR) 145 MG tablet Take 105 mg by mouth every other day.  . gabapentin (NEURONTIN) 300 MG capsule Take 300 mg by mouth at bedtime.  Marland Kitchen [place] 420 MG/3.5ML SOCT INJECT ONE PEN INTO THE SKIN EVERY MONTH     Allergies:   Codeine   Past Medical History:  Diagnosis Date  . Bulging lumbar disc   . Hypercholesteremia   . Osteopenia   . Seasonal allergies   . Torn ACL    & meniscus  . Torn rotator cuff   . Ulcer     Past Surgical History:  Procedure Laterality Date  . APPENDECTOMY    . COLONOSCOPY N/A 01/22/2016   Procedure: COLONOSCOPY;  Surgeon: Rogene Houston, MD;  [facility];  Service: Endoscopy;  Laterality: N/A;  1015     Social History:  The patient  reports that she has never smoked. She has never used smokeless tobacco. She reports  that she does not drink alcohol or use drugs.   Family History:  The patient's family history includes Cancer in her daughter and father; Heart attack in her father; Heart disease in her brother; Multiple myeloma in her mother.    ROS:  Please see the history of present illness. All other systems are reviewed and  Negative to the above problem except as noted.    PHYSICAL EXAM: VS:  BP 126/78   Pulse 70   Temp 97.8 F (36.6 C)   Ht _0  (1.676 m)   Wt 198 lb (89.8 kg)   LMP 08/17/1998   SpO2 99%   BMI 31.96 kg/m   GEN: Obese 69 yo , in no acute distress  HEENT: normal  Neck: no JVD, carotid bruits, or masses Cardiac: RRR; no murmurs, rubs, or gallops,no edema  Respiratory:  clear to auscultation bilaterally, normal work of breathing GI: soft, nontender, nondistended, + BS  No hepatomegaly  MS: no deformity Moving all extremities   Skin: warm and dry, no rash Neuro:  Strength and sensation are intact Psych: euthymic mood, full affect   EKG:  EKG is  not ordered today.   Lipid Panel No results found for: CHOL, TRIG, HDL, CHOLHDL, VLDL, LDLCALC, LDLDIRECT    Wt Readings from Last 3 Encounters:  10/02/19 198 lb (89.8 kg)  01/22/16 165 lb (74.8 kg)  06/13/15 178 lb (80.7 kg)      ASSESSMENT AND PLAN:  1  Hyperlpidemia.  I do not have labs to review  Sounds like it is Merdis Delay signfiicant   Strong FHX of CAD   Responded to [place].  Interesting that it was approved with just LE ABI  Not USN  WIll get labs   Review with lipid clinic about what needed  Consider Ca score CT  2  HCM   Doing well  No CP or SOB   Discussed wt  She is trying to lose   Also had first dose of COVID vaccine          Current medicines are reviewed at length with the patient today.  The patient does not have concerns regarding medicines.  Signed, Dorris Carnes, MD  10/02/2019 10:13 AM    [address], [place], [place]  [address] Phone: [phone]; Fax: [phone]

## 2019-10-02 NOTE — Progress Notes (Signed)
Cardiology Office Note   Date:  10/02/2019   ID:  Terri, Robertson 15-Aug-1951, MRN [id number]  PCP:  Terri Lords, NP  Cardiologist:   Terri Carnes, MD   Pt referred by B Dekoninck for lipids/Repatha approval    History of Present Illness: Terri Robertson is a 69 y.o. female with a history of chest pain in past   She was seen by Terri Robertson in 2015   Stress echo was done   She exercised for 10 min   Echo was normal   The pt also has a history of HL.   The pt had been on statins  in past but could not tolerate   [name] prescribed Repatha and it was approved initially   Lipids on Repatha improved significantly per pt report   (not sent)   Unfortunately, now not getting approval    Pt did have LE arterial dopplers in 2019 that sugg atherosclerosis.  She denies CP  Breathing is good  She is very active  Exercises regularly Working on diet to lose wt  Good wt for her is 170       Current Meds  Medication Sig  . Calcium Carb-Cholecalciferol (CALCIUM 600/VITAMIN D3) 600-800 MG-UNIT TABS Take 3 tablets by mouth daily.  . cetirizine (ZYRTEC) 10 MG tablet Take 10 mg by mouth daily.  . fenofibrate (TRICOR) 145 MG tablet Take 105 mg by mouth every other day.  . gabapentin (NEURONTIN) 300 MG capsule Take 300 mg by mouth at bedtime.  Marland Kitchen [name] SOCT INJECT ONE PEN INTO THE SKIN EVERY MONTH     Allergies:   Codeine   Past Medical History:  Diagnosis Date  . Bulging lumbar disc   . Hypercholesteremia   . Osteopenia   . Seasonal allergies   . Torn ACL    & meniscus  . Torn rotator cuff   . Ulcer     Past Surgical History:  Procedure Laterality Date  . APPENDECTOMY    . COLONOSCOPY N/A 01/22/2016   Procedure: COLONOSCOPY;  Surgeon: Rogene Houston, MD;  [facility];  Service: Endoscopy;  Laterality: N/A;  1015     Social History:  The patient  reports that she has never smoked. She has never used smokeless tobacco. She reports that  she does not drink alcohol or use drugs.   Family History:  The patient's family history includes Cancer in her daughter and father; Heart attack in her father; Heart disease in her brother; Multiple myeloma in her mother.    ROS:  Please see the history of present illness. All other systems are reviewed and  Negative to the above problem except as noted.    PHYSICAL EXAM: VS:  BP 126/78   Pulse 70   Temp 97.8 F (36.6 C)   Ht 5' 6"  (1.676 m)   Wt 198 lb (89.8 kg)   LMP 08/17/1998   SpO2 99%   BMI 31.96 kg/m   GEN: Obese 69 yo , in no acute distress  HEENT: normal  Neck: no JVD, carotid bruits, or masses Cardiac: RRR; no murmurs, rubs, or gallops,no edema  Respiratory:  clear to auscultation bilaterally, normal work of breathing GI: soft, nontender, nondistended, + BS  No hepatomegaly  MS: no deformity Moving all extremities   Skin: warm and dry, no rash Neuro:  Strength and sensation are intact Psych: euthymic mood, full affect   EKG:  EKG is not  ordered today.   Lipid Panel No results found for: CHOL, TRIG, HDL, CHOLHDL, VLDL, LDLCALC, LDLDIRECT    Wt Readings from Last 3 Encounters:  10/02/19 198 lb (89.8 kg)  01/22/16 165 lb (74.8 kg)  06/13/15 178 lb (80.7 kg)      ASSESSMENT AND PLAN:  1  Hyperlpidemia.  I do not have labs to review   SOunds signfiicant   Strong FHX of CAD   Responded to [place].  Interesting that it was approved with just LE ABI  Not USN  WIll get labs   Review with lipid clinic about what needed  Consider Ca score CT  2  HCM   Doing well  No CP or SOB   Discussed wt  She is trying to lose   Also had first dose of COVID vaccine     Current medicines are reviewed at length with the patient today.  The patient does not have concerns regarding medicines.  Signed, Terri Carnes, MD  10/02/2019 10:13 AM    Terri Robertson, [place], [place]  [address] Phone: [phone]; Fax: [phone]

## 2019-10-03 ENCOUNTER — Telehealth: Payer: Self-pay | Admitting: Pharmacist

## 2019-10-03 NOTE — Telephone Encounter (Signed)
Working on Psychologist, forensic for patients Repatha 420mg  pushtronix and need more information. Indication is primary hyperlipidemia. Need to know what statins she has tried in the past and what happened when she took them. I will write appeals letter and fax to [place]. Patient has been on therapy. Last LDL was 39 on 09/04/2019. Looks like baseline is around 155.  Patient knows that she has been on crestor, lipitor and pravastatin. But unsure of the doses. She states she thinks she might have some files at home and will call tomorrow.

## 2019-10-04 NOTE — Telephone Encounter (Signed)
Pt called clinic and left a voicemail - she has previously tried pravastatin 40mg  which caused myalgias, dose was reduced to 20mg  but symptoms did not improve. She then tried rosuvastatin 5mg  daily which also caused myalgias.  Returned call to pt and let her know that we received her information and will give her an update once we hear back on appeals.

## 2019-10-04 NOTE — Telephone Encounter (Signed)
Appeals letter faxed 10/04/19

## 2019-10-20 DIAGNOSIS — Z23 Encounter for immunization: Secondary | ICD-10-CM | POA: Diagnosis not present

## 2019-10-24 NOTE — Telephone Encounter (Signed)
Called [employer]. Appeals overturned. Approved through 10/03/20. Appears fax was sent to PCP office. Called patient to let her know incase she was not aware. Left message for her to call back.

## 2019-10-26 NOTE — Telephone Encounter (Signed)
Patient returned called and left VM on machine - states she was aware that [place] had been approved. Called patient to confirm we received her message and no need to call back unless she has questions

## 2020-04-09 DIAGNOSIS — E785 Hyperlipidemia, unspecified: Secondary | ICD-10-CM | POA: Diagnosis not present

## 2020-04-09 DIAGNOSIS — R7303 Prediabetes: Secondary | ICD-10-CM | POA: Diagnosis not present

## 2020-04-11 DIAGNOSIS — L578 Other skin changes due to chronic exposure to nonionizing radiation: Secondary | ICD-10-CM | POA: Diagnosis not present

## 2020-04-11 DIAGNOSIS — L821 Other seborrheic keratosis: Secondary | ICD-10-CM | POA: Diagnosis not present

## 2020-04-11 DIAGNOSIS — L720 Epidermal cyst: Secondary | ICD-10-CM | POA: Diagnosis not present

## 2020-04-11 DIAGNOSIS — D1801 Hemangioma of skin and subcutaneous tissue: Secondary | ICD-10-CM | POA: Diagnosis not present

## 2020-04-11 DIAGNOSIS — L814 Other melanin hyperpigmentation: Secondary | ICD-10-CM | POA: Diagnosis not present

## 2020-04-16 DIAGNOSIS — H5203 Hypermetropia, bilateral: Secondary | ICD-10-CM | POA: Diagnosis not present

## 2020-04-16 DIAGNOSIS — H2513 Age-related nuclear cataract, bilateral: Secondary | ICD-10-CM | POA: Diagnosis not present

## 2020-04-16 DIAGNOSIS — H524 Presbyopia: Secondary | ICD-10-CM | POA: Diagnosis not present

## 2020-04-16 DIAGNOSIS — H52223 Regular astigmatism, bilateral: Secondary | ICD-10-CM | POA: Diagnosis not present

## 2020-04-17 DIAGNOSIS — R7303 Prediabetes: Secondary | ICD-10-CM | POA: Diagnosis not present

## 2020-04-17 DIAGNOSIS — E785 Hyperlipidemia, unspecified: Secondary | ICD-10-CM | POA: Diagnosis not present

## 2020-04-17 DIAGNOSIS — R197 Diarrhea, unspecified: Secondary | ICD-10-CM | POA: Diagnosis not present

## 2020-06-25 DIAGNOSIS — Z23 Encounter for immunization: Secondary | ICD-10-CM | POA: Diagnosis not present

## 2020-07-06 DIAGNOSIS — Z23 Encounter for immunization: Secondary | ICD-10-CM | POA: Diagnosis not present

## 2020-07-19 IMAGING — MG DIGITAL SCREENING BILATERAL MAMMOGRAM WITH TOMO AND CAD
8 series · 9 of 24 positions shown · non-contrast
Comparison: None.

CLINICAL DATA: Screening.

EXAM:
DIGITAL SCREENING BILATERAL MAMMOGRAM WITH TOMO AND CAD

[R MLO synth-2D]
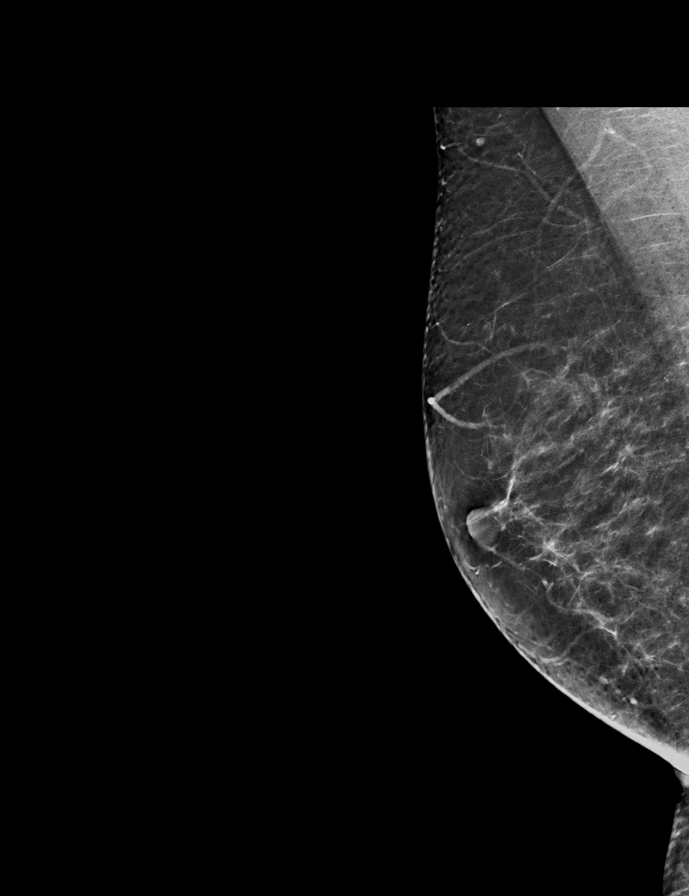

[L CC synth-2D]
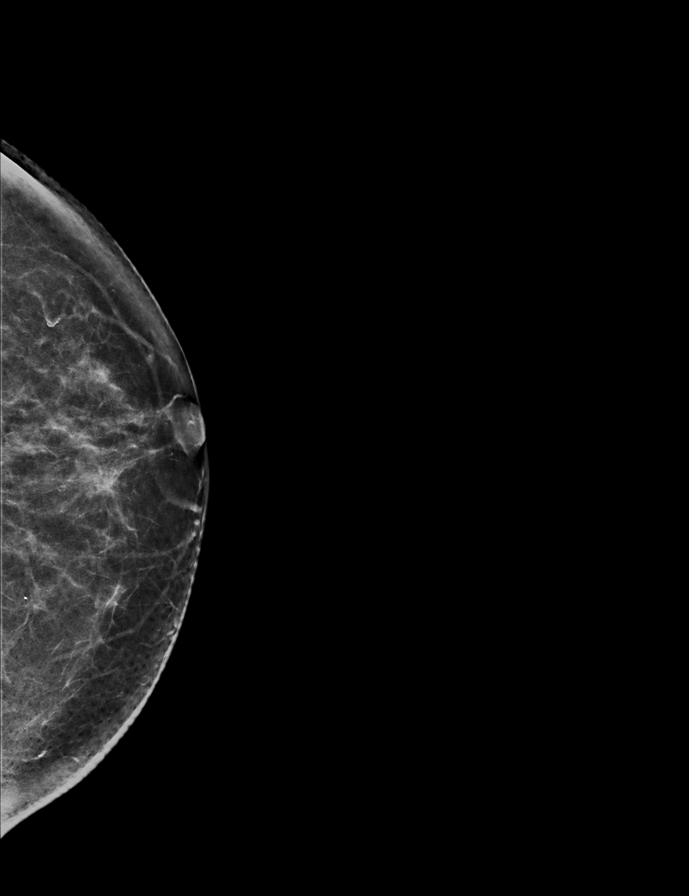

[R CC synth-2D]
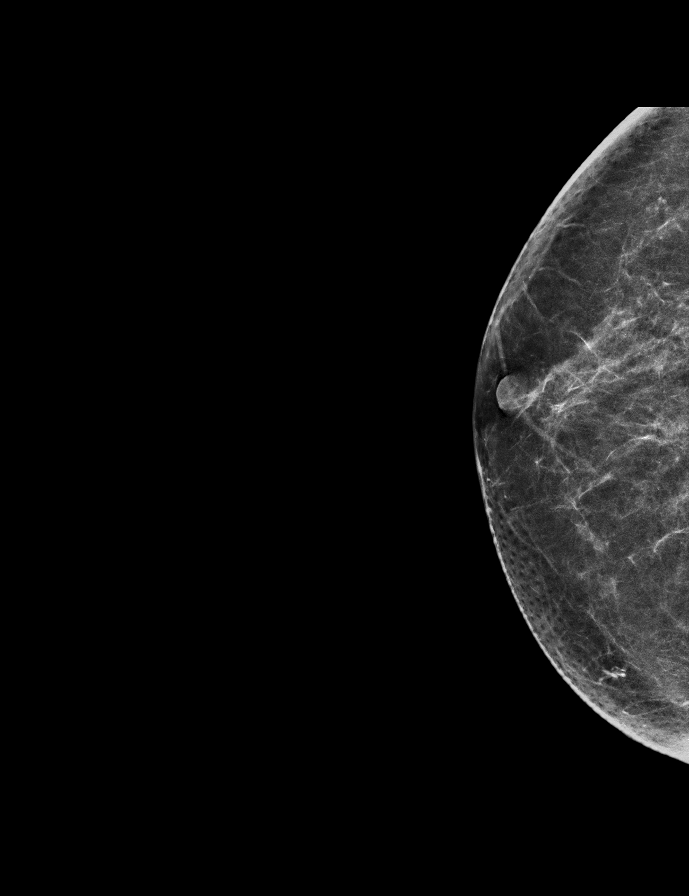

[L MLO synth-2D]
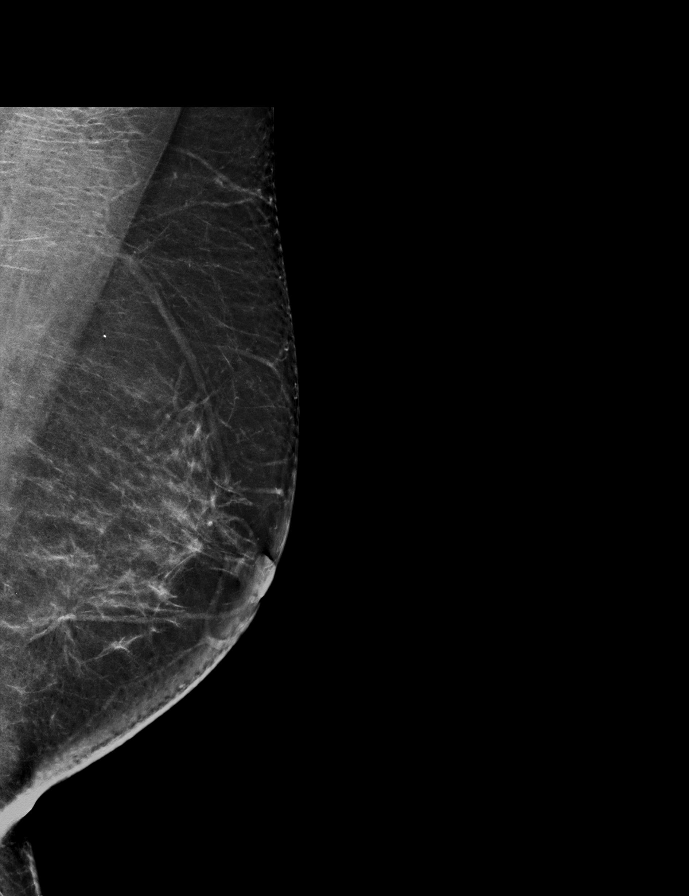

[R MLO tomo · 2 of 68 frames shown]
[frame 22/68]
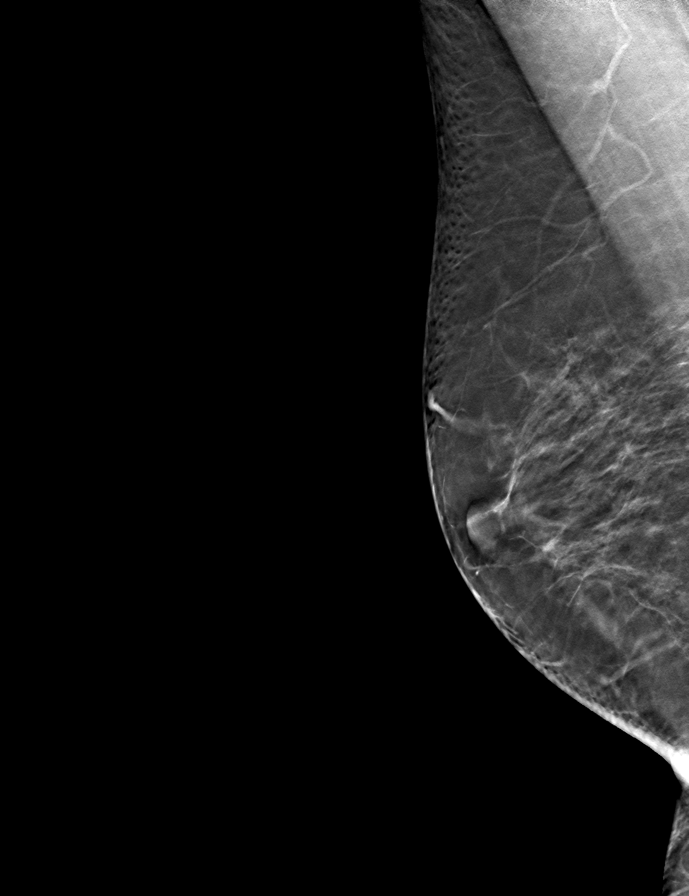
[frame 35/68]
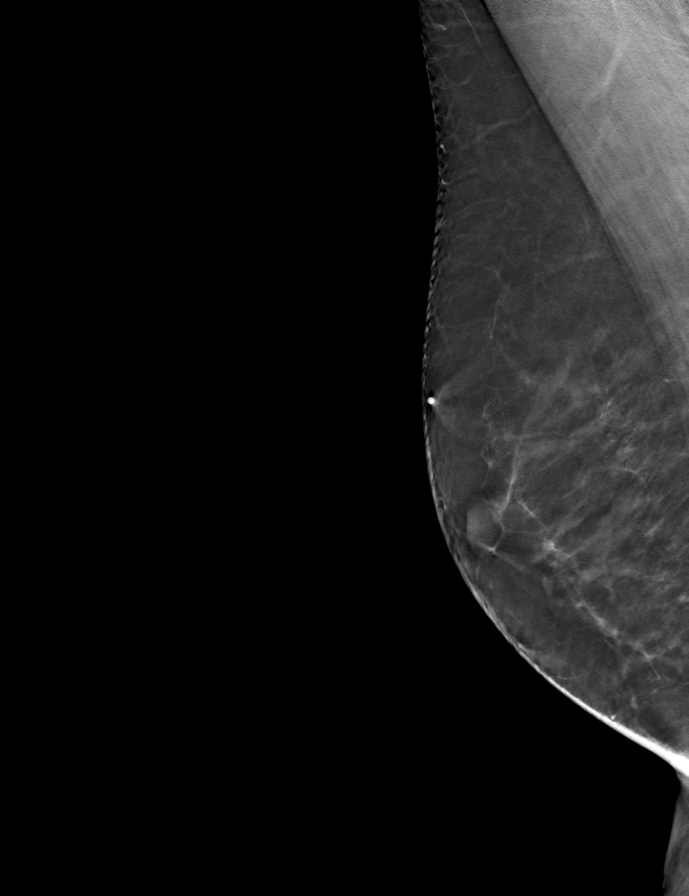

[L CC tomo · tomo slice 35/68.0]
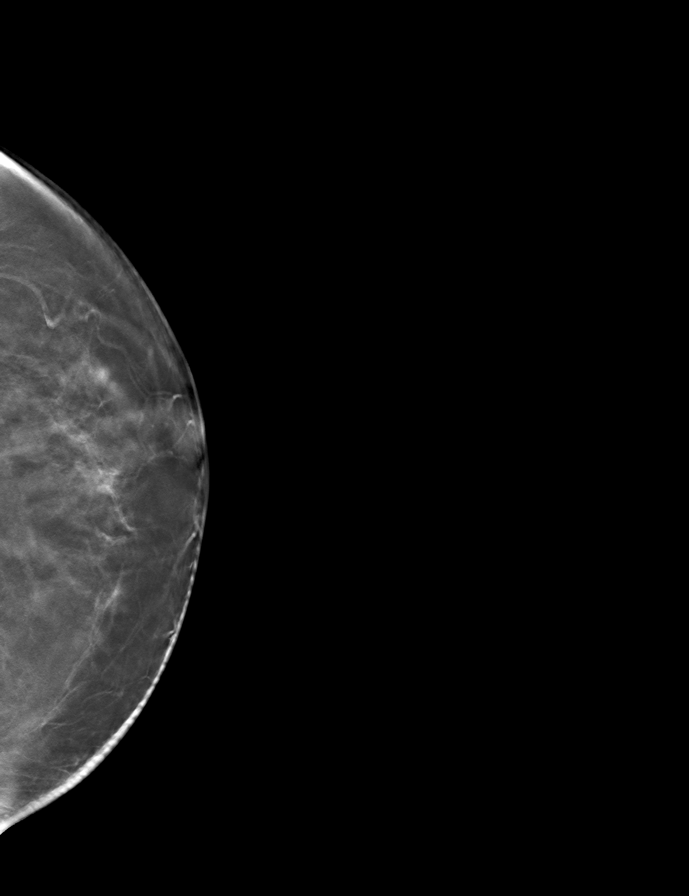

[R CC tomo · tomo slice 33/65.0]
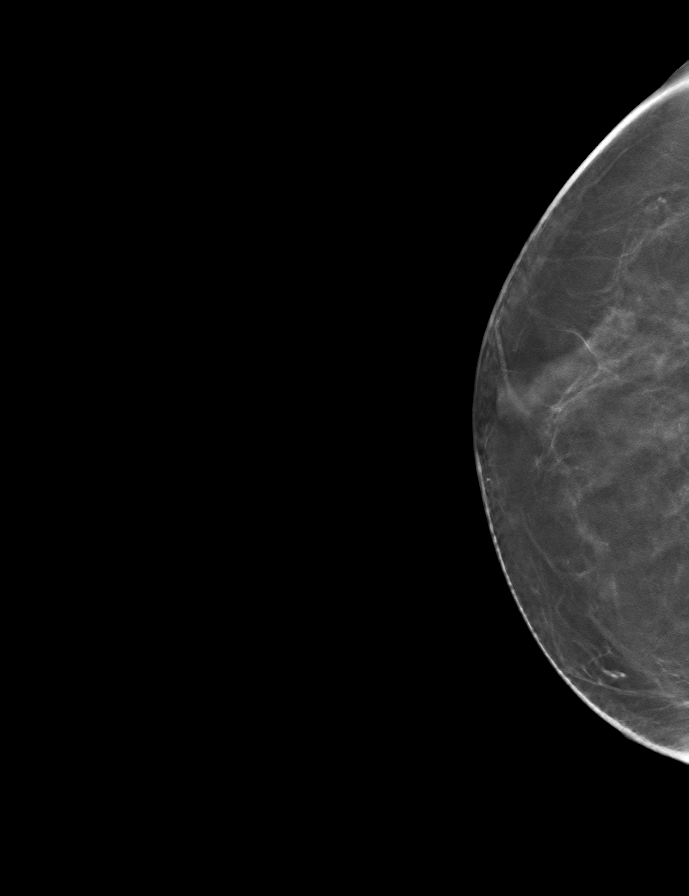

[L MLO tomo · tomo slice 39/76.0]
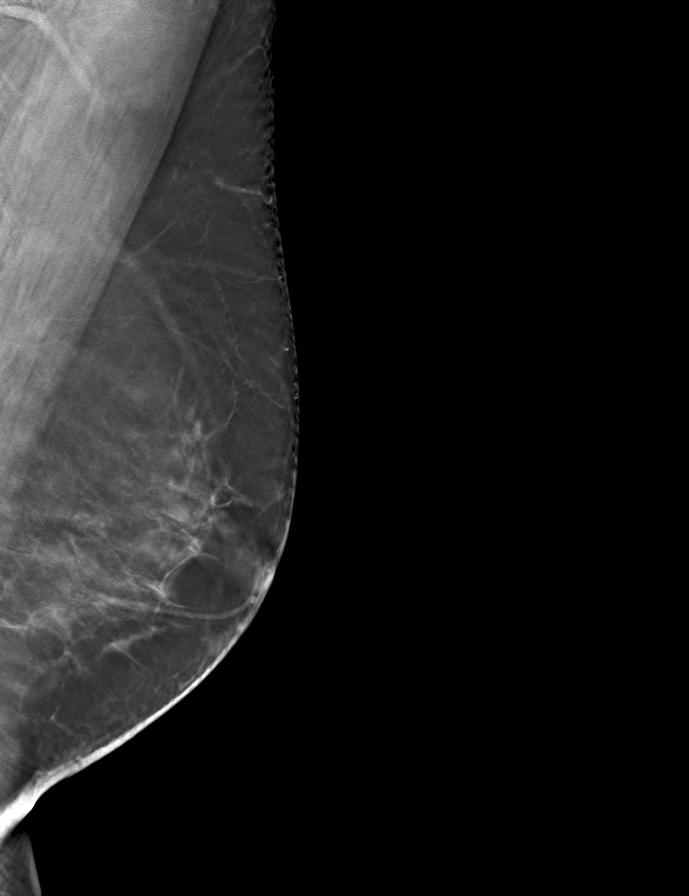

[9 of 24 positions shown; findings below may reference images not displayed]

ACR Breast Density Category c: The breast tissue is heterogeneously
dense, which may obscure small masses.
FINDINGS: In the left breast, a possible asymmetry warrants further
evaluation. In the right breast, no findings suspicious for
malignancy. Images were processed with CAD.
IMPRESSION: Further evaluation is suggested for possible asymmetry in the left
breast.

RECOMMENDATION:
Diagnostic mammogram and possibly ultrasound of the left breast.
(Code:[id number]-[id number])

The patient will be contacted regarding the findings, and additional
imaging will be scheduled.

BI-RADS CATEGORY  0: Incomplete. Need additional imaging evaluation
and/or prior mammograms for comparison.

## 2024-01-12 ENCOUNTER — Other Ambulatory Visit (HOSPITAL_COMMUNITY): Payer: Self-pay | Admitting: Family Medicine

## 2024-01-12 DIAGNOSIS — Z136 Encounter for screening for cardiovascular disorders: Secondary | ICD-10-CM

## 2024-01-18 ENCOUNTER — Ambulatory Visit (HOSPITAL_COMMUNITY)
Admission: RE | Admit: 2024-01-18 | Discharge: 2024-01-18 | Disposition: A | Discharge status: Home / Self Care | Source: Ambulatory Visit | Attending: Family Medicine | Admitting: Family Medicine

## 2024-01-18 DIAGNOSIS — Z136 Encounter for screening for cardiovascular disorders: Secondary | ICD-10-CM | POA: Insufficient documentation
